# Patient Record
Sex: Female | Born: 1960
Health system: Southern US, Community
[De-identification: ages and names within clinical notes are randomized; demographics above are authoritative.]

## PROBLEM LIST (undated history)

## (undated) DIAGNOSIS — I10 Essential (primary) hypertension: Secondary | ICD-10-CM

## (undated) DIAGNOSIS — B1920 Unspecified viral hepatitis C without hepatic coma: Secondary | ICD-10-CM

---

## 2003-11-25 ENCOUNTER — Observation Stay (HOSPITAL_COMMUNITY): Admission: AD | Admit: 2003-11-25 | Discharge: 2003-11-26 | Payer: Self-pay | Admitting: Family Medicine

## 2006-08-18 ENCOUNTER — Emergency Department (HOSPITAL_COMMUNITY): Admission: EM | Admit: 2006-08-18 | Discharge: 2006-08-18 | Payer: Self-pay | Admitting: Emergency Medicine

## 2007-10-10 ENCOUNTER — Ambulatory Visit: Payer: Self-pay | Admitting: *Deleted

## 2007-12-15 ENCOUNTER — Ambulatory Visit: Payer: Self-pay | Admitting: Family Medicine

## 2008-02-08 ENCOUNTER — Ambulatory Visit: Payer: Self-pay | Admitting: Internal Medicine

## 2008-02-14 ENCOUNTER — Encounter (INDEPENDENT_AMBULATORY_CARE_PROVIDER_SITE_OTHER): Payer: Self-pay | Admitting: Family Medicine

## 2008-02-14 ENCOUNTER — Ambulatory Visit: Payer: Self-pay | Admitting: Internal Medicine

## 2008-02-14 LAB — CONVERTED CEMR LAB
ALT: 22 U/L
AST: 22 U/L
Albumin: 4.4 g/dL
Alkaline Phosphatase: 45 U/L
BUN: 13 mg/dL
Basophils Absolute: 0 K/uL
Basophils Relative: 0 %
CO2: 23 meq/L
Calcium: 9.2 mg/dL
Chloride: 105 meq/L
Cholesterol: 198 mg/dL
Creatinine, Ser: 0.72 mg/dL
Eosinophils Absolute: 0 K/uL
Eosinophils Relative: 1 %
Glucose, Bld: 134 mg/dL — ABNORMAL HIGH
HCT: 39 %
HDL: 62 mg/dL
Hemoglobin: 12.4 g/dL
LDL Cholesterol: 119 mg/dL — ABNORMAL HIGH
Lymphocytes Relative: 47 % — ABNORMAL HIGH
Lymphs Abs: 3.4 K/uL
MCHC: 31.8 g/dL
MCV: 90.9 fL
Monocytes Absolute: 0.6 K/uL
Monocytes Relative: 9 %
Neutro Abs: 3.2 K/uL
Neutrophils Relative %: 44 %
Platelets: 187 K/uL
Potassium: 4.2 meq/L
RBC: 4.29 M/uL
RDW: 14.8 %
Sodium: 139 meq/L
TSH: 2.299 u[IU]/mL
Total Bilirubin: 0.5 mg/dL
Total CHOL/HDL Ratio: 3.2
Total Protein: 6.6 g/dL
Triglycerides: 84 mg/dL
VLDL: 17 mg/dL
Vit D, 1,25-Dihydroxy: 10 — ABNORMAL LOW
WBC: 7.4 10*3/microliter

## 2008-04-19 ENCOUNTER — Ambulatory Visit: Payer: Self-pay | Admitting: Internal Medicine

## 2008-05-03 ENCOUNTER — Ambulatory Visit: Payer: Self-pay | Admitting: Family Medicine

## 2008-10-04 ENCOUNTER — Ambulatory Visit: Payer: Self-pay | Admitting: Family Medicine

## 2008-10-04 LAB — CONVERTED CEMR LAB
Cholesterol: 185 mg/dL (ref 0–200)
Microalb, Ur: 1.85 mg/dL (ref 0.00–1.89)
VLDL: 20 mg/dL (ref 0–40)
Vit D, 25-Hydroxy: 66 ng/mL (ref 30–89)

## 2009-02-28 ENCOUNTER — Ambulatory Visit: Payer: Self-pay | Admitting: Family Medicine

## 2009-07-10 ENCOUNTER — Ambulatory Visit: Payer: Self-pay | Admitting: Family Medicine

## 2009-07-10 LAB — CONVERTED CEMR LAB
AST: 31 units/L (ref 0–37)
Alkaline Phosphatase: 58 units/L (ref 39–117)
CO2: 26 meq/L (ref 19–32)
Chloride: 103 meq/L (ref 96–112)
Creatinine, Ser: 0.85 mg/dL (ref 0.40–1.20)
HCV Quantitative: 60500000 intl units/mL — ABNORMAL HIGH (ref <43)
Potassium: 4.3 meq/L (ref 3.5–5.3)
Sodium: 140 meq/L (ref 135–145)

## 2009-09-06 ENCOUNTER — Ambulatory Visit: Payer: Self-pay | Admitting: Family Medicine

## 2009-11-28 ENCOUNTER — Emergency Department (HOSPITAL_COMMUNITY): Admission: EM | Admit: 2009-11-28 | Discharge: 2009-11-28 | Payer: Self-pay | Admitting: Family Medicine

## 2010-03-23 ENCOUNTER — Encounter: Payer: Self-pay | Admitting: Pulmonary Disease

## 2010-03-23 ENCOUNTER — Encounter: Payer: Self-pay | Admitting: Family Medicine

## 2010-04-17 ENCOUNTER — Ambulatory Visit: Payer: Self-pay | Admitting: Gastroenterology

## 2010-04-17 DIAGNOSIS — B182 Chronic viral hepatitis C: Secondary | ICD-10-CM

## 2010-05-06 ENCOUNTER — Other Ambulatory Visit: Payer: Self-pay | Admitting: Gastroenterology

## 2010-05-06 DIAGNOSIS — B1920 Unspecified viral hepatitis C without hepatic coma: Secondary | ICD-10-CM

## 2010-05-14 ENCOUNTER — Ambulatory Visit (HOSPITAL_COMMUNITY): Payer: Self-pay

## 2010-05-14 ENCOUNTER — Other Ambulatory Visit: Payer: Self-pay | Admitting: Diagnostic Radiology

## 2010-05-14 ENCOUNTER — Ambulatory Visit (HOSPITAL_COMMUNITY)
Admission: RE | Admit: 2010-05-14 | Discharge: 2010-05-14 | Disposition: A | Discharge status: Home / Self Care | Payer: Self-pay | Source: Ambulatory Visit | Attending: Gastroenterology | Admitting: Gastroenterology

## 2010-05-14 ENCOUNTER — Other Ambulatory Visit: Payer: Self-pay

## 2010-05-14 DIAGNOSIS — L408 Other psoriasis: Secondary | ICD-10-CM | POA: Insufficient documentation

## 2010-05-14 DIAGNOSIS — Z01812 Encounter for preprocedural laboratory examination: Secondary | ICD-10-CM | POA: Insufficient documentation

## 2010-05-14 DIAGNOSIS — B1920 Unspecified viral hepatitis C without hepatic coma: Secondary | ICD-10-CM

## 2010-05-14 DIAGNOSIS — I10 Essential (primary) hypertension: Secondary | ICD-10-CM | POA: Insufficient documentation

## 2010-05-14 DIAGNOSIS — B182 Chronic viral hepatitis C: Secondary | ICD-10-CM | POA: Insufficient documentation

## 2010-05-14 DIAGNOSIS — E119 Type 2 diabetes mellitus without complications: Secondary | ICD-10-CM | POA: Insufficient documentation

## 2010-05-14 LAB — PROTIME-INR: Prothrombin Time: 12.5 seconds (ref 11.6–15.2)

## 2010-05-14 LAB — CBC
MCH: 28.7 pg (ref 26.0–34.0)
Platelets: 146 10*3/uL — ABNORMAL LOW (ref 150–400)
RBC: 4.57 MIL/uL (ref 3.87–5.11)
WBC: 6.8 10*3/uL (ref 4.0–10.5)

## 2010-05-14 LAB — APTT: aPTT: 36 seconds (ref 24–37)

## 2010-05-15 LAB — GLUCOSE, CAPILLARY: Glucose-Capillary: 119 mg/dL — ABNORMAL HIGH (ref 70–99)

## 2010-07-18 NOTE — H&P (Signed)
NAME:  [name], MARTELL NO.:  [id number]   MEDICAL RECORD NO.:  [id number]          PATIENT TYPE:  AMB   LOCATION:  SDC                           FACILITY:  [facility]   PHYSICIAN:  Naima A. Dillard, M.D. DATE OF BIRTH:  [birth year]   DATE OF ADMISSION:  01/21/2004  DATE OF DISCHARGE:                                HISTORY & PHYSICAL   CHIEF COMPLAINT:  Right Bartholin's abscess.   HISTORY OF PRESENT ILLNESS:  The patient is a 50 year old African American  female, gravida 5, para 2-0-3-2 who presented in the emergency room back in  September 2005 with a right Bartholin's abscess.  The patient states that  she does not recall ever having a Bartholin's cyst or abscess in the past.  It was drained, and a catheter was placed and the abscess was successfully  drained and treated.  She also was treated with antibiotics at the time.  The patient has asked for marsipulization of the cyst with a biopsy.  She  says she has never had a Bartholin's cyst before.   PAST MEDICAL HISTORY:  Significant for hypertension.   PAST SURGICAL HISTORY:  Significant for elective abortion x3 without any  complication.  Tubes tied.  She also had her wisdom teeth removed.   PAST GYNECOLOGIC HISTORY:  Significant for menarche at age 54, lasting for 3-  4 days.  She does have a history of Trichomonas and ovarian cysts.  The  patient states that she never had an abnormal Pap smear.   ALLERGIES:  The patient has no known drug allergies.   MEDICATIONS:  She is currently on no medications.   SOCIAL HISTORY:  The patient denies any alcohol use.  She does occasionally  use cocaine which she is seeking help to rid herself of the habit.  She  denies any tobacco use.   FAMILY HISTORY:  Significant for high blood pressure, diabetes, sickle cell  disease and heart disease.   REVIEW OF SYSTEMS:  Genitourinary as noted above.  Cardiovascular is  significant for hypertension.  GI is unremarkable.   Psychosocial is  significant for cocaine abuse.  Musculoskeletal is unremarkable.   PHYSICAL EXAMINATION:  VITAL SIGNS:  The patient's blood pressure is 120/90.  HEENT:  Hearing is normal.  Throat is clear.  NECK:  Thyroid is not enlarged.  HEART:  Regular rate and rhythm.  LUNGS:  Clear to auscultation bilaterally.  BACK:  No CVA tenderness bilaterally.  ABDOMEN:  Nontender without any masses, organomegaly.  EXTREMITIES:  No cyanosis, clubbing or edema.  NEUROLOGICAL: Exam is within normal limits.  PELVIC:  The patient had a right Bartholin abscess that was drained with the  Wurd catheter.  There was no more purulent material exiting from the  abscess.  The area was clearing and healing in the normal fashion.  The  vagina was normal.  The cervix was normal.  It was nontender without any  lesions.  Uterus was normal shape, size and consistency and nontender.  Adnexae has no masses and is nontender.   ASSESSMENT:  Bartholin's abscess.  PLAN:  The patient is changed to Keflex.  We will plan to do  marsupialization and biopsy of the cyst.  The patient is supposed to  schedule an annual exam and has not yet showed up for her Pap smear.  We  will plan to do so.  The patient understands the risks of the procedure,  bleeding, infection, damage to internal organs such as vagina and vulva.  The patient consents to the procedure.     Naim   NAD/MEDQ  D:  01/21/2004  T:  01/21/2004  Job:  [id number]

## 2010-09-02 ENCOUNTER — Emergency Department (HOSPITAL_COMMUNITY)
Admission: EM | Admit: 2010-09-02 | Discharge: 2010-09-02 | Disposition: A | Discharge status: Home / Self Care | Payer: Self-pay | Attending: Emergency Medicine | Admitting: Emergency Medicine

## 2010-09-02 ENCOUNTER — Inpatient Hospital Stay (INDEPENDENT_AMBULATORY_CARE_PROVIDER_SITE_OTHER)
Admission: RE | Admit: 2010-09-02 | Discharge: 2010-09-02 | Disposition: A | Discharge status: Home / Self Care | Payer: Self-pay | Source: Ambulatory Visit | Attending: Family Medicine | Admitting: Family Medicine

## 2010-09-02 DIAGNOSIS — R5381 Other malaise: Secondary | ICD-10-CM | POA: Insufficient documentation

## 2010-09-02 DIAGNOSIS — I10 Essential (primary) hypertension: Secondary | ICD-10-CM | POA: Insufficient documentation

## 2010-09-02 DIAGNOSIS — E119 Type 2 diabetes mellitus without complications: Secondary | ICD-10-CM | POA: Insufficient documentation

## 2010-09-02 DIAGNOSIS — R7989 Other specified abnormal findings of blood chemistry: Secondary | ICD-10-CM

## 2010-09-02 LAB — POCT URINALYSIS DIP (DEVICE)
Ketones, ur: 15 mg/dL — AB
Protein, ur: NEGATIVE mg/dL
Specific Gravity, Urine: <=1.005 (ref 1.005–1.030)

## 2010-09-02 LAB — CBC
HCT: 40 % (ref 36.0–46.0)
Hemoglobin: 14.4 g/dL (ref 12.0–15.0)
MCV: 83.7 fL (ref 78.0–100.0)
RBC: 4.78 MIL/uL (ref 3.87–5.11)
WBC: 12 10*3/uL — ABNORMAL HIGH (ref 4.0–10.5)

## 2010-09-02 LAB — URINALYSIS, ROUTINE W REFLEX MICROSCOPIC
Glucose, UA: >1000 mg/dL — AB
Hgb urine dipstick: NEGATIVE
Leukocytes, UA: NEGATIVE
Specific Gravity, Urine: 1.039 — ABNORMAL HIGH (ref 1.005–1.030)
Urobilinogen, UA: 0.2 mg/dL (ref 0.0–1.0)

## 2010-09-02 LAB — URINE MICROSCOPIC-ADD ON

## 2010-09-02 LAB — DIFFERENTIAL
Eosinophils Relative: 0 % (ref 0–5)
Lymphocytes Relative: 28 % (ref 12–46)
Lymphs Abs: 3.4 10*3/uL (ref 0.7–4.0)
Neutro Abs: 7.7 10*3/uL (ref 1.7–7.7)
Neutrophils Relative %: 64 % (ref 43–77)

## 2010-09-02 LAB — POCT I-STAT, CHEM 8
BUN: 14 mg/dL (ref 6–23)
Chloride: 90 mEq/L — ABNORMAL LOW (ref 96–112)
Creatinine, Ser: 0.9 mg/dL (ref 0.50–1.10)
Sodium: 129 mEq/L — ABNORMAL LOW (ref 135–145)
TCO2: 25 mmol/L (ref 0–100)

## 2010-09-02 LAB — GLUCOSE, CAPILLARY: Glucose-Capillary: >600 mg/dL (ref 70–99)

## 2010-09-02 LAB — BASIC METABOLIC PANEL
BUN: 14 mg/dL (ref 6–23)
CO2: 24 mEq/L (ref 19–32)
Chloride: 88 mEq/L — ABNORMAL LOW (ref 96–112)
Creatinine, Ser: 0.9 mg/dL (ref 0.50–1.10)
Glucose, Bld: 620 mg/dL (ref 70–99)
Potassium: 4.3 mEq/L (ref 3.5–5.1)

## 2010-09-04 LAB — URINE CULTURE: Culture  Setup Time: 201207040216

## 2010-10-03 ENCOUNTER — Telehealth: Payer: Self-pay | Admitting: Gastroenterology

## 2010-10-03 NOTE — Telephone Encounter (Signed)
Questions for the application for free medication answered.  [facility] will review the application and fax a reply to [facility].

## 2010-10-09 ENCOUNTER — Other Ambulatory Visit: Payer: Self-pay | Admitting: Gastroenterology

## 2010-10-09 ENCOUNTER — Telehealth: Payer: Self-pay | Admitting: Gastroenterology

## 2010-10-09 DIAGNOSIS — B182 Chronic viral hepatitis C: Secondary | ICD-10-CM

## 2010-10-09 NOTE — Telephone Encounter (Signed)
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 She is requesting her appointment be any time after 2pm due to her work schedule if possible. These are the following meds that she has:  Pegasys  Copegus  Incivek/Telaprevir   [facility]  [address]  [place], [place] [address]  [phone]  This electronic message may contain information that is confidential and/or legally privileged. It is intended only for the use of the designated individual(s) and entity named aboved. If you are not the an intented recipient of this message, please notify the sender immediately and delete the material from any computer. Do not deliver, distribute or copy this message, and do not disclose its contents.

## 2010-10-09 NOTE — Progress Notes (Signed)
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 She is requesting her appointment be any time after 2pm due to her work schedule if possible. These are the following meds that she has:  Pegasys Copegus Incivek/Telatrevir  [facility]  [address]  [place], [place] [address] [phone] This electronic message may contain information that is confidential and/or legally privileged. It is intended only for the use of the designated individual(s) and entity named aboved. If you are not the an intented recipient of this message, please notify the sender immediately and delete the material from any computer. Do not deliver, distribute or copy this message, and do not disclose its contents.

## 2010-10-23 ENCOUNTER — Other Ambulatory Visit: Payer: Self-pay | Admitting: Gastroenterology

## 2010-10-23 ENCOUNTER — Ambulatory Visit (INDEPENDENT_AMBULATORY_CARE_PROVIDER_SITE_OTHER): Payer: Self-pay | Admitting: Gastroenterology

## 2010-10-23 DIAGNOSIS — B182 Chronic viral hepatitis C: Secondary | ICD-10-CM

## 2010-10-27 LAB — HEPATITIS C RNA QUANTITATIVE
HCV Quantitative Log: 7.34 {Log} — ABNORMAL HIGH (ref <1.63)
HCV Quantitative: 21700000 [IU]/mL — ABNORMAL HIGH (ref <43)

## 2010-10-30 ENCOUNTER — Ambulatory Visit (INDEPENDENT_AMBULATORY_CARE_PROVIDER_SITE_OTHER): Payer: Self-pay | Admitting: Gastroenterology

## 2010-10-30 VITALS — BP 115/80 | HR 90 | Temp 96.7°F | Ht 62.0 in | Wt 109.0 lb

## 2010-10-30 DIAGNOSIS — B182 Chronic viral hepatitis C: Secondary | ICD-10-CM

## 2010-11-06 ENCOUNTER — Ambulatory Visit (INDEPENDENT_AMBULATORY_CARE_PROVIDER_SITE_OTHER): Payer: Self-pay | Admitting: Gastroenterology

## 2010-11-06 DIAGNOSIS — B182 Chronic viral hepatitis C: Secondary | ICD-10-CM

## 2010-11-06 LAB — CBC WITH DIFFERENTIAL/PLATELET
Basophils Absolute: 0 10*3/uL (ref 0.0–0.1)
Basophils Relative: 0 % (ref 0–1)
Eosinophils Relative: 1 % (ref 0–5)
Hemoglobin: 11.8 g/dL — ABNORMAL LOW (ref 12.0–15.0)
MCHC: 32.7 g/dL (ref 30.0–36.0)
Monocytes Absolute: 0.6 10*3/uL (ref 0.1–1.0)
Neutro Abs: 2.8 10*3/uL (ref 1.7–7.7)
RBC: 4.06 MIL/uL (ref 3.87–5.11)
RDW: 14.6 % (ref 11.5–15.5)
WBC: 6.2 10*3/uL (ref 4.0–10.5)

## 2010-11-06 NOTE — Progress Notes (Signed)
  NAME:  [name], [name]  MR#:  [id number]      DATE:  10/23/2010  DOB:  06-Aug-1960    cc:     Referring physician:   Dow Adolph, MD   [facility]  [address] [place],   [place]   [address] Fax:  [phone]   REASON FOR VISIT:  Follow up genotype 1B HCV for initiation of therapy.    History of presenting illness:  The patient returns today unaccompanied. Since last being seen, there are no symptoms referable to her history of hepatitis C, nor are there symptoms to suggest cryoglobulin mediated or decompensated liver  disease.  It will be recalled that she is genotype 1B, IL28-B CT, liver biopsy showing grade 2 stage 1 disease on 05/14/2010.   After discussion, the patient wants to pursue treatment, and I will start her on pegylated interferon, ribavirin, and telaprevir. She comes in today for teaching.    Past medical history:  In the interval, she has become an insulin-requiring diabetic.   CURRENT MEDICATIONS:  1. Novolin-N on a sliding scale a.c. 2. Januvia 100 mg p.o. daily Metformin 5 mg p.o. b.i.d. 3. Micardis 40 mg p.o. daily.   Allergies:  Denies.   Smoking:  Never.    Alcohol:  Denies interval consumption.   REVIEW OF SYSTEMS:  All 10 systems reviewed today with the patient, and they are negative other than that which is mentioned above. Her CES-D was 10.   PHYSICAL EXAMINATION:  Constitutional:  Well appearing without significant peripheral wasting. Vital signs: Height 62 inches, weight 116, pounds blood pressure 163/101, pulse of 98, temperature 96.6 Fahrenheit.   ASSESSMENT:  The patient is a 50 year old woman with a history of genotype 1B hepatitis C, IL28-B CT, liver biopsy of 05/14/2010 showing grade 2  stage 1 disease. She is going to commence therapy on 10/24/2010 with PEG interferon, ribavirin, and telaprevir.   DISCUSSION:  Today, I discussed with the patient methods by which she should administer her pegylated  interferon. I demonstrated to her with her medications how to do the injections and how to titrate the dose based  on my instructions. We also discussed dosing of the telaprevir. I have given the patient an instruction manual from [facility] and reviewed the  importance of timing of her dosing and compliance and the side effects. We also discussed dosing of the ribavirin.   plan:  1. Will check a baseline HCV RNA down pretreatment. 2. She is to start tomorrow on 10/24/2010 as her start date. 3. She is to return in 2 weeks' time for her first CBC. 4. She needs hepatitis A and B vaccinations through [facility].            Brooke Dare, MD   [phone]  D:  Thu Aug 23 21:27:34 2012 ; T:  Sat Aug 25 14:05:58 2012  Job #:  [id number]

## 2010-11-13 ENCOUNTER — Other Ambulatory Visit: Payer: Self-pay | Admitting: Gastroenterology

## 2010-11-13 LAB — CBC WITH DIFFERENTIAL/PLATELET
Basophils Absolute: 0 K/uL (ref 0.0–0.1)
Basophils Relative: 0 % (ref 0–1)
Eosinophils Absolute: 0 K/uL (ref 0.0–0.7)
Eosinophils Relative: 0 % (ref 0–5)
HCT: 35.7 % — ABNORMAL LOW (ref 36.0–46.0)
Hemoglobin: 11.2 g/dL — ABNORMAL LOW (ref 12.0–15.0)
Lymphocytes Relative: 47 % — ABNORMAL HIGH (ref 12–46)
Lymphs Abs: 2.5 K/uL (ref 0.7–4.0)
MCH: 28.6 pg (ref 26.0–34.0)
MCHC: 31.4 g/dL (ref 30.0–36.0)
MCV: 91.3 fL (ref 78.0–100.0)
Monocytes Absolute: 0.5 K/uL (ref 0.1–1.0)
Monocytes Relative: 9 % (ref 3–12)
Neutro Abs: 2.3 K/uL (ref 1.7–7.7)
Neutrophils Relative %: 44 % (ref 43–77)
Platelets: 105 K/uL — ABNORMAL LOW (ref 150–400)
RBC: 3.91 MIL/uL (ref 3.87–5.11)
RDW: 14.5 % (ref 11.5–15.5)
WBC: 5.3 K/uL (ref 4.0–10.5)

## 2010-11-13 NOTE — Progress Notes (Signed)
NAME:  [name], [name]  MR#:  [id number]      DATE:  10/30/2010  DOB:  [birth year]    cc: Referring Physician:  Dow Adolph, MD, [facility] [address], [address] [place], [place] [address], Fax [phone]    REASON FOR VISIT:  Nausea on week 1 of genotype 1B HCV therapy.    History:  The patient returns today as a walk-in unaccompanied. I saw her last week on 10/23/2010, for teaching. She was to commence her PEG interferon injection the next day on 10/24/2010, as well as her  ribavirin and telaprevir oral therapy. Today, she reports that because her grandchildren were coming over to visit her on the weekend, she decided to delay her PEG interferon injection until Monday, but then  never took that at all the last week. However, she started the telaprevir and ribavirin on 10/24/2010, and continued it until this morning. She has experienced some nausea, which worsened today leading  her to come in without an appointment to discuss this. Because of the nausea, she did not take any of her medications this morning such that she has missed 1 dose of telaprevir this morning, in addition to  missing her dose of Pegylated interferon last week. Aside from the nausea, there are no complaints at this time.   Past MEDICAL HISTORY:  Type 2 diabetes.   CURRENT MEDICATIONS:  Micardis 40 mg p.o. daily, metformin 5 mg p.o. daily, Actos 15 mg p.o. b.i.d., Pegasys 180 mcg subcu weekly, ribavirin 600 mg p.o. a.m., 400 mg p.o. p.m. and telaprevir 750 mg p.o. q. 8 hours.    Allergies:  Denies.    Habits:  Smoking, never. Alcohol denies interval consumption.   REVIEW OF SYSTEMS:  All 10 systems reviewed today with the patient and they are negative other which is mentioned above. She did not complete the CES-D today.   PHYSICAL EXAMINATION:   Constitutional:  No apparent distress. She is very thin but no significant peripheral wasting. Vital signs: Height 62 inches, weight  109 pounds, down from 116  previously, blood pressure 115/80, pulse 90, temperature 96.7 Fahrenheit.   ASSESSMENT:  The patient is a 50 year old woman with a history of genotype 1b hepatitis C, IL 28 B CT, liver biopsy on 05/14/2010, showing grade 2, stage I disease. She was supposed to commence all 3 drugs on  10/24/2010, but only commenced the telaprevir and ribavirin on that day and has missed the entire week of pegylated interferon. In addition, she has missed her dose of telaprevir this morning. This all  increases the risk of a not only nonresponse to treatment but resistance to the protease inhibitor class of antivirals. If she misses another dose of her medication, I will need to discontinue her  treatment altogether.  Today, I explained to her that we can use Phenergan to treat her nausea such that she remain on her oral medications. I admonished her for not taking the pegylated interferon. I have explained to her that  she cannot pick and choose the medication that she can take on this regimen, because the risk of toxicities, the resistance, and nonresponse to treatment. I have therefore, I have given her a  prescription for Phenergan instructing her how to use this. In addition I instructed her that she is to take her telaprevir dosing on  schedule today. I have also told her that she needs to take the Pegasys tomorrow, which will be her second of treatment.   plan:  1. Phenergan 25 mg p.o. t.i.d., 80 tablets with 1 refill given. 2. She is to take her telaprevir and ribavirin today as scheduled. 3. Her first injection of Pegasys will be at week 2, tomorrow on 10/31/2010. 4. She is to return for her week 2 visit, next week on 11/06/2010. 5. She needs hepatitis A and B vaccination through [facility].            Brooke Dare, MD   [phone]  D:  Thu Aug 30 18:37:53 2012 ; T:  Thu Aug 30 21:39:05 2012  Job #:  [id number]

## 2010-11-20 ENCOUNTER — Ambulatory Visit (INDEPENDENT_AMBULATORY_CARE_PROVIDER_SITE_OTHER): Payer: Self-pay | Admitting: Gastroenterology

## 2010-11-20 DIAGNOSIS — B182 Chronic viral hepatitis C: Secondary | ICD-10-CM

## 2010-11-20 NOTE — Progress Notes (Signed)
   NAME:  [name], [name]  MR#:  [id number]      DATE:  11/06/2010  DOB:  1May 07, 1962    cc:     Referring physician:  Dow Adolph, MD [facility] [address]  [place], [place]  [address] Fax:  [phone]   REASON FOR VISIT:  Follow up of genotype 1b HCV, week 2 of therapy.    HISTORY:  The patient returns today unaccompanied. It will be recalled that she presented to the clinic on 10/30/2010, having started her ribavirin and telaprevir without starting her pegylated interferon on  10/27/2010. She had delayed the start of the pegylated interferon for fear of side effects and did not start it before she presented with several complaints. At the time I reassured her that the side effects  were consistent with her treatment but admonished her for not starting treatment with all 3 drugs together as I instructed her to do at the teaching session.  Today she reports that,  in addition to starting the ribavirin and telaprevir on 10/27/2010, she started the Pegasys on 10/31/2010. At this time she does not have any complaints referable to her treatment.  She notes that she has gained 4 pounds since her last clinic appointment. She has not needed any Phenergan for the complaints of nausea that she had when I saw her last time.   PAST MEDICAL HISTORY:  Significant for type 2 diabetes. She reports her blood sugar this morning was 114.    CURRENT MEDICATIONS:  1. Telaprevir 750 mg p.o. q.8 hours. 2. Ribavirin 600 mg mg p.o. q.a.m., 400 mg p.o. q. p.m.  3. Pegasys 180 mcg subcu weekly.  4. Novolin N on sliding scale a.c.  5. Metformin 500 mg p.o. b.i.d. 6. Micardis 40 mg p.o. daily.  7. Actos 15 mg p.o. daily.  Allergies:  Denies.   habits:   Smoking:  Never.    Alcohol:  Denies interval consumption.   REVIEW OF SYSTEMS:  All 10 systems reviewed today with the patient, and they are negative other than that which was mentioned above. Her CES-D was 21.    PHYSICAL  EXAMINATION:   Constitutional:  Well appearing.   Vital signs:  Height 62 inches, weight 113 pounds, blood pressure 129/88, pulse of 97, temperature 97.6 degrees Fahrenheit.    ASSESSMENT:  The patient is a 50 year old woman with genotype 1b hepatitis C virus, IL28B CT, with liver biopsy on 05/14/2010 showing grade 2, stage I disease. For the purposes of calculation, she commenced therapy on  10/27/2010, though her first dose of Pegasys was 4 days late.  She is now approximately week 2 of treatment. She is now tolerating therapy much better with education.  In my discussion today with the patient we discussed the course of therapy to date. We discussed side effect management.    Baseline viral load 21,700,000 international units per mL on 10/23/2010.   plan:  1. CBC today. 2. She was given written standing orders for weekly CBC to commence next week.  3. She needs hepatitis A and B vaccinations through [facility]. 4. She is to return in 2 weeks' time for week 4 visit with a CBC and viral load at that time.            Brooke Dare, MD   [phone]  D:  Thu Sep 06 21:10:51 2012 ; T:  Sat Sep 08 17:17:07 2012  Job #:  [id number]

## 2010-11-21 LAB — HEPATIC FUNCTION PANEL
ALT: 13 U/L (ref 0–35)
Albumin: 4.5 g/dL (ref 3.5–5.2)
Total Protein: 7 g/dL (ref 6.0–8.3)

## 2010-11-21 LAB — TSH: TSH: 1.386 u[IU]/mL (ref 0.350–4.500)

## 2010-11-27 NOTE — Progress Notes (Signed)
   NAME:  [name], [name]  MR#:  [id number]      DATE:  11/20/2010  DOB:  1April 24, 1962    cc: Referring Physician:  Dow Adolph, MD, [address] [place], [place] [address], Fax [phone]    REASON FOR VISIT:  Follow up of genotype 1b HCV week 4 of treatment.   History:  The patient returns today unaccompanied. She is due to have her fourth injection of Pegasys tomorrow. She says she is compliant with all medications. She has no complaints currently. Within the last week or  so, she was nauseated at work and her employer wanted to send her home, but she insisted on staying. She brought in an FMLA form that she thought would need to be completed if she was to take time off  work, but then realizing I would have to state her diagnosis, she did not want the form completed.   PAST MEDICAL HISTORY:  Significant for type 2 diabetes.   CURRENT MEDICATIONS:  Telaprevir 750 mg p.o. q. 8 hours, ribavirin 600 mg p.o. q.a.m., 400 mg p.o. q. p.m., Pegasys 180 mcg subcu weekly, Novolin sliding scale  a.c., metformin 500 mg p.o. b.i.d., Micardis 40 mg p.o. daily, Actos 15 mg p.o. daily.   ALLERGIES:  Denies.   HABITS:  Smoking, never. Alcohol, denies interval consumption.   REVIEW OF SYSTEMS:  All 10 systems reviewed today with the patient and they are negative other than which was mentioned above. Her CES-D was not completed.   PHYSICAL EXAMINATION:   Constitutional:  Well appearing. Vital signs: Height 62 inches, weight 113 pounds unchanged from previously, blood pressure 123/87, pulse of 89, temperature 97.4 Fahrenheit.   laboratories:  On 11/06/2010; her hemoglobin was 11.8 from her pretreatment of 14.4, white count 6.2, ANC 42.8, platelet count 150.   ASSESSMENT:  The patient is a 50 year old woman with history of genotype 1b hepatitis C, IL 28 B CT, a liver biopsy on 05/14/2010, showing grade 2 stage I disease. For the purpose of calculation, she commenced  therapy  on 10/27/2010, her fourth dose of Pegasys was 4 days late. She is now at approximately week 4 of treatment. She is tolerating therapy well.  In my discussion today, we discussed the importance of compliance with treatment, which she states she understands. We also discussed the side effects management.   Plan:  1. CBC today. 2. Week 4 HCV RNA today. 3. Liver enzymes today. 4. She will need hepatitis A and B vaccination through [facility]. 5. To return in 2 week's time.            Brooke Dare, MD   ADDEDNUM  CBC and Week 4 viral load was not done as ordered.  I will ask that she be called to get a viral load the next week.  [phone] .[id number]  D:  Thu Sep 20 19:25:42 2012 ; T:  Thu Sep 20 19:59:16 2012  Job #:  [id number]

## 2010-11-28 ENCOUNTER — Other Ambulatory Visit: Payer: Self-pay | Admitting: Gastroenterology

## 2010-11-28 LAB — CBC WITH DIFFERENTIAL/PLATELET
Basophils Relative: 0 % (ref 0–1)
Hemoglobin: 9.5 g/dL — ABNORMAL LOW (ref 12.0–15.0)
MCHC: 31.5 g/dL (ref 30.0–36.0)
Monocytes Relative: 13 % — ABNORMAL HIGH (ref 3–12)
Neutro Abs: 1.7 10*3/uL (ref 1.7–7.7)
Neutrophils Relative %: 41 % — ABNORMAL LOW (ref 43–77)
Platelets: 140 10*3/uL — ABNORMAL LOW (ref 150–400)
RBC: 3.31 MIL/uL — ABNORMAL LOW (ref 3.87–5.11)

## 2010-12-02 LAB — HCV RNA QUANT RFLX ULTRA OR GENOTYP
HCV Quantitative Log: <1.63 {Log} (ref <1.63)
HCV Quantitative: <43 IU/mL (ref <43)

## 2010-12-04 ENCOUNTER — Ambulatory Visit (INDEPENDENT_AMBULATORY_CARE_PROVIDER_SITE_OTHER): Payer: Self-pay | Admitting: Gastroenterology

## 2010-12-04 ENCOUNTER — Other Ambulatory Visit: Payer: Self-pay | Admitting: Gastroenterology

## 2010-12-04 DIAGNOSIS — B182 Chronic viral hepatitis C: Secondary | ICD-10-CM

## 2010-12-04 LAB — CBC WITH DIFFERENTIAL/PLATELET
Basophils Absolute: 0 10*3/uL (ref 0.0–0.1)
Basophils Relative: 0 % (ref 0–1)
Hemoglobin: 9.1 g/dL — ABNORMAL LOW (ref 12.0–15.0)
MCHC: 31.2 g/dL (ref 30.0–36.0)
Monocytes Relative: 12 % (ref 3–12)
Neutro Abs: 2.5 10*3/uL (ref 1.7–7.7)
Neutrophils Relative %: 58 % (ref 43–77)
Platelets: 129 10*3/uL — ABNORMAL LOW (ref 150–400)
RDW: 14.3 % (ref 11.5–15.5)

## 2010-12-11 ENCOUNTER — Other Ambulatory Visit: Payer: Self-pay | Admitting: Gastroenterology

## 2010-12-11 NOTE — Progress Notes (Signed)
NAME:  [name], [name]  MR#:  [id number]      DATE:  12/04/2010  DOB:  [birth year]    cc: Referring Physician:  Dow Adolph, MD, [facility] [address], [address], [place], [place] [address], Fax [phone]    REASON FOR VISIT:  Followup genotype 1b HCV, week 6 of treatment.   History:  The patient returns today unaccompanied. She is due to have her sixth injection of Pegasys tomorrow. She states she is compliant with medications, however the patient complains of significant nausea. She  is concerned that she may have vomited doses of telaprevir perhaps once a week in the morning when she takes her morning medications. She also complains of significant fatigue, dizziness, and lightheadedness.  Within the last few days the patient reported reaching into a pocket and sticking her right D3 digits with a pin. It subsequently became  infected and currently looks like she has a small abscess in the tip of her finger. It will be recalled that she is a diabetic.   PAST MEDICAL HISTORY:  Significant for type 2 diabetes. She reports blood sugars are as low as 114 first thing in the morning, fasting.   CURRENT MEDICATIONS:  Novolin 20 units subcu daily, telaprevir 750 mg p.o. q. 8 hours, ribavirin 600 mg p.o. q. a.m., 400 mg p.o. q. p.m., Pegasys 180 mcg  subcu weekly, metformin 500 mg p.o. b.i.d., Januvia dose unknown daily.   Allergies:  Denies.   HABITS:  Smoking, never. Alcohol, denies interval consumption.   REVIEW OF SYSTEMS:  All 10 systems reviewed today with the patient and they are negative other than which is mentioned above. She did not complete the CES-D work sheet.   PHYSICAL EXAMINATION:   Constitutional:  Well appearing but thin. Vital signs: Height 62 inches, weight 112 pounds down a pound from previously, blood pressure 138/86, pulse of 102, temperature 97.6 Fahrenheit.   The tip of the right D3 finger had a small, approximately 7 mm abscess. I swabbed it with alcohol  and with a 25-gauge needle opened it and drained it fully.   Laboratories:  Lab work from 11/20/2010; ALT was 13. TSH was 1.386.   Her lab work on 11/27/2010 were not done as ordered, and therefore she was brought back on 11/28/2010 for the following labs. Her CBC showed  a white count of 4, hemoglobin was 9.5, MCV 91.2, platelet count 140,000. Her ANC was 1.7. HCV RNA was detectable but not quantifiable.   ASSESSMENT:  The patient is a 50 year old woman with a history of genotype 1ab hepatitis C, IL 28 b CT, liver biopsy 05/14/2010, showing grade 2 stage I disease. For the purpose of calculation, she commenced therapy  on 10/27/2010. It will be recalled that her fourth dose of Pegasys was 4 days late. She is now approximately week 6 of therapy. I am concerned about the nausea and vomiting that she is having. In  addition, she has developed an infected tip of the right third digit, which I have now I and D.  In my discussion today with the patient, I instructed she to soak the finger in peroxide twice daily but not apply any alcohol. We also discussed the importance of starting on antibiotics for this. We then  discussed the importance of complying with all of her medications for hepatitis C therapy. To that end, we discussed using antinausea medications.   PLAN:  1. For the anemia, I have instructed her to reduce the ribavirin  to 400 mg p.o. q. a.m. and 200 mg p.o. p.m. 2. Keflex 500 mg p.o. q.i.d., 10 days supply for the finger. 3. Soak the finger in peroxide twice daily. 4. She is to continue with weekly CBCs. 5. For nausea, I have given her Phenergan 12.5 mg p.o. q. 6 hours p.r.n., number 120 with 3 refills. 6. She is to return in 2 weeks' time. 7. Will need hepatitis A and B vaccination through [facility].            Brooke Dare, MD   [phone]  D:  Thu Oct 04 15:20:53 2012 ; T:  Thu Oct 04 16:04:03 2012  Job #:  [id number]

## 2010-12-12 LAB — CBC WITH DIFFERENTIAL/PLATELET
Basophils Absolute: 0 10*3/uL (ref 0.0–0.1)
Lymphocytes Relative: 40 % (ref 12–46)
Lymphs Abs: 1.5 10*3/uL (ref 0.7–4.0)
Neutro Abs: 1.7 10*3/uL (ref 1.7–7.7)
Neutrophils Relative %: 46 % (ref 43–77)
Platelets: 165 10*3/uL (ref 150–400)
RBC: 2.99 MIL/uL — ABNORMAL LOW (ref 3.87–5.11)
RDW: 14.7 % (ref 11.5–15.5)
WBC: 3.6 10*3/uL — ABNORMAL LOW (ref 4.0–10.5)

## 2010-12-18 ENCOUNTER — Other Ambulatory Visit: Payer: Self-pay | Admitting: Gastroenterology

## 2010-12-18 LAB — CBC WITH DIFFERENTIAL/PLATELET
Basophils Absolute: 0 10*3/uL (ref 0.0–0.1)
Basophils Relative: 0 % (ref 0–1)
HCT: 29.2 % — ABNORMAL LOW (ref 36.0–46.0)
Hemoglobin: 8.9 g/dL — ABNORMAL LOW (ref 12.0–15.0)
Lymphocytes Relative: 32 % (ref 12–46)
Monocytes Absolute: 0.5 10*3/uL (ref 0.1–1.0)
Monocytes Relative: 12 % (ref 3–12)
Neutro Abs: 2.5 10*3/uL (ref 1.7–7.7)
Neutrophils Relative %: 55 % (ref 43–77)
WBC: 4.5 10*3/uL (ref 4.0–10.5)

## 2010-12-19 ENCOUNTER — Ambulatory Visit (INDEPENDENT_AMBULATORY_CARE_PROVIDER_SITE_OTHER): Payer: Self-pay | Admitting: Gastroenterology

## 2010-12-19 DIAGNOSIS — B182 Chronic viral hepatitis C: Secondary | ICD-10-CM

## 2010-12-25 ENCOUNTER — Other Ambulatory Visit: Payer: Self-pay | Admitting: Gastroenterology

## 2010-12-25 NOTE — Progress Notes (Signed)
NAMEMarland Kitchen  [name], [name]  MR#:  [id number]      DATE:  12/19/2010  DOB:  [birth year]    cc: Dow Adolph, MD, [facility], [phone] [address]. [address] [place], [place] [address], Fax No. [phone]    Referring physician:  Dow Adolph, MD, [address], [address], [place], [place] [address], Fax [phone]   REASON FOR VISIT:  Followup genotype 1B hepatitis C virus, week 8 of treatment.    History:  The patient returns today unaccompanied. She is due to take her eighth injection of Pegasys tomorrow. She reports compliance with all medications. Today, she still has some problems with anorexia, and her  weight is down another 3 pounds from previous.   It will be recalled that at the last clinic appointment I incised and drained and abscess developing on the tip of her right D3 finger and gave her a course of Keflex.  She reports she has 2 doses of the  Keflex left, but the finger was significantly better right after the incision and drainage and has completely resolved.   She complains of lightheadedness and being "dizzy."  She reports that her blood sugars are controlled.   PAST MEDICAL HISTORY:  Significant for type 2 diabetes.   CURRENT MEDICATIONS:  1. Novolin 20 units subcu daily. 2. Telaprevir 750 mg p.o. 3. Ribavirin, dose reduced to 400 mg a.m., 200 mg p.m. for anemia. 4. Pegasys 180 micrograms subcu weekly. 5. Metformin 500 mg b.i.d.  6. Januvia, dose unknown, daily.   Allergies:  Denies.    Smoking:  Never.    Alcohol:  Denies interval consumption.   REVIEW OF SYSTEMS:  All 10 systems reviewed today with the patient and are negative other than that which is mentioned above. Her CES-D was 15.   PHYSICAL EXAMINATION:   Constitutional:  The patient is well appearing. She was thin.  VITAL SIGNS:  Height 62 inches, weight 109 pounds, blood pressure 110/84, pulse 76, temperature 98 degrees Fahrenheit.   Laboratory data:  Previously from 12/18/2010 her  CBC showed  a white count of 4.5, ANC 2.5, hemoglobin 8.9, platelet count of 197.  Prior to this on 12/11/2010 her hemoglobin was 8.5.   Assessment:  The patient is a 50 year old woman with genotype 1b hepatitis C virus, IL28B CT, with liver biopsy 05/14/2010 showing grade 2 stage I disease. She commenced therapy on 10/27/2010. Her eighth dose of  Pegasys will be due tomorrow. Her viral load was not done at precisely week 4 due to problems with the lab.  Therefore, at approximately week 4-1/2 of therapy her viral load was detectable but not quantifiable.  She is tolerating therapy with the usual side effect of anemia.  The dose reduction of ribavirin should stabilize her hemoglobin without the use of erythropoietin hopefully.   In my discussion today with the patient we discussed her lab test results from previous, including her hemoglobin. She does not want to consider using erythropoietin injection.  We discussed possibly  a further dose reduction of ribavirin for the anemia. She also does not want to consider this because she is afraid to fall below 600 mg a day in case it would affect her chance of SVR.  She states that she can handle the anemia as it is.   Plan:  1. We will not change her dose of ribavirin, or Pegasys. 2. She is to continue with weekly CBCs. 3. I will see her again in 4 weeks' time to get her  week 12 HCV RNA done. 4. She is to remain on the same dose of ribavirin at 400 mg a.m. 200 mg p.m. with no change in telaprevir or Pegasys. 5. She is in need of hepatitis A and B vaccinations through [facility]. 6. Return in 4 weeks' time at week 12 for HCV RNA.            Brooke Dare, MD    [phone]  D:  Fri Oct 19 16:28:36 2012 ; T:  Sat Oct 20 13:16:39 2012  Job #:  [id number]

## 2010-12-26 LAB — CBC WITH DIFFERENTIAL/PLATELET
Basophils Absolute: 0 10*3/uL (ref 0.0–0.1)
Eosinophils Relative: 0 % (ref 0–5)
HCT: 28.2 % — ABNORMAL LOW (ref 36.0–46.0)
Hemoglobin: 8.5 g/dL — ABNORMAL LOW (ref 12.0–15.0)
Lymphocytes Relative: 37 % (ref 12–46)
Lymphs Abs: 1.5 10*3/uL (ref 0.7–4.0)
MCV: 95.6 fL (ref 78.0–100.0)
Monocytes Absolute: 0.7 10*3/uL (ref 0.1–1.0)
Monocytes Relative: 16 % — ABNORMAL HIGH (ref 3–12)
Neutro Abs: 2 10*3/uL (ref 1.7–7.7)
RBC: 2.95 MIL/uL — ABNORMAL LOW (ref 3.87–5.11)
RDW: 16.8 % — ABNORMAL HIGH (ref 11.5–15.5)
WBC: 4.2 10*3/uL (ref 4.0–10.5)

## 2011-01-01 ENCOUNTER — Other Ambulatory Visit: Payer: Self-pay | Admitting: Gastroenterology

## 2011-01-01 ENCOUNTER — Ambulatory Visit (INDEPENDENT_AMBULATORY_CARE_PROVIDER_SITE_OTHER): Payer: Self-pay | Admitting: Gastroenterology

## 2011-01-01 DIAGNOSIS — B182 Chronic viral hepatitis C: Secondary | ICD-10-CM

## 2011-01-01 LAB — CBC WITH DIFFERENTIAL/PLATELET
Eosinophils Absolute: 0 10*3/uL (ref 0.0–0.7)
HCT: 30.3 % — ABNORMAL LOW (ref 36.0–46.0)
Hemoglobin: 9.4 g/dL — ABNORMAL LOW (ref 12.0–15.0)
Lymphs Abs: 1.5 10*3/uL (ref 0.7–4.0)
MCH: 29.7 pg (ref 26.0–34.0)
Monocytes Absolute: 0.8 10*3/uL (ref 0.1–1.0)
Monocytes Relative: 16 % — ABNORMAL HIGH (ref 3–12)
Neutrophils Relative %: 53 % (ref 43–77)
RBC: 3.17 MIL/uL — ABNORMAL LOW (ref 3.87–5.11)

## 2011-01-08 ENCOUNTER — Other Ambulatory Visit: Payer: Self-pay | Admitting: Gastroenterology

## 2011-01-08 LAB — CBC WITH DIFFERENTIAL/PLATELET
Eosinophils Relative: 1 % (ref 0–5)
HCT: 30.1 % — ABNORMAL LOW (ref 36.0–46.0)
Lymphocytes Relative: 34 % (ref 12–46)
Lymphs Abs: 1.6 10*3/uL (ref 0.7–4.0)
MCH: 29.1 pg (ref 26.0–34.0)
MCV: 97.4 fL (ref 78.0–100.0)
Monocytes Absolute: 0.6 10*3/uL (ref 0.1–1.0)
RBC: 3.09 MIL/uL — ABNORMAL LOW (ref 3.87–5.11)
RDW: 17.5 % — ABNORMAL HIGH (ref 11.5–15.5)
WBC: 4.7 10*3/uL (ref 4.0–10.5)

## 2011-01-08 NOTE — Progress Notes (Signed)
NAME:  [name], [name]  MR#:  [id number]      DATE:  01/01/2011  DOB:  [birth year]    cc: Primary Care Physician:  Same Referring Physician:  Dow Adolph, MD, [address], [place], [place] [address], Fax [phone]    REASON FOR VISIT:  Follow up genotype 1b hepatitis C virus week 10 of treatment.   History:  The patient returns today unaccompanied. She came in today without an appointment complaining of significant pruritic erythematous eruption over her abdomen and legs. It will be recalled that otherwise her  course of therapy he has been complicated by anemia requiring dose reduction of ribavirin. Since last being seen on 12/19/2010, the patient developed a significant  erythematous eruption over over her  flanks, anterior of the abdomen and her legs and elbows. This is significantly pruritic. She has only tried Vaseline and baby oil for this but did not try any antihistamine or topical over-the-counter  steroids. It reached such an intensity last night that she came in to be seen today without an appointment. Otherwise, she has no complaints.   PAST MEDICAL HISTORY:  Significant for type 2 diabetes.   CURRENT MEDICATIONS:  Novolin 20 units subcu daily, telaprevir 750 mg p.o. q. 8 hours, ribavirin 400 mg p.o. a.m., 200 mg p.o. q. p.m. dose reduced for  anemia, Pegasys 180 mcg subcu weekly, metformin 500 mg b.i.d. Januvia dose unknown daily.    Allergies:  Denies.   Habits:  Smoking, never. Alcohol, denies interval consumption.   REVIEW OF SYSTEMS:  All 10 systems reviewed today with the patient and they are negative other than which is mentioned above. She did not complete the CES-D today.   PHYSICAL EXAMINATION:   Constitutional:  Thin appearing but otherwise well. Vital signs: Height 62 inches, weight 110 pounds up a pound from previous. Blood pressure 125/95, pulse 102, temperature 97.1 Fahrenheit.  Dermatologic examination:  There is a large  erythematous plaque over the right flank just lateral to the right upper quadrant. In addition, there is erythema and dryness over all 4 quadrants of the abdomen.  There is similar erythematous plaques on the elbows and patches over the shins of the legs.   Laboratories:  On 12/25/2010, her hemoglobin was 8.5, MCV 95.6, platelet count of 163, white count 4.2, ANC 2.0.  Previously on 12/18/2010, hemoglobin was 8.9.    assessment:  The patient is a 50 year old woman with a history of genotype 1b hepatitis C, IL28B CT, liver biopsy in 05/14/2010 showing grade 2 stage I disease. She commenced therapy on 10/27/2010. She is currently  week 10 of HCV therapy. Her week 4 HCV RNA was not done precisely on time, but at approximately week 4-1/2, it was detectable but not quantifiable. In addition, her first week of treatment was compromised  by not taking her Pegasys. Today, the pruritic eruption she comes in with is probably from the telaprevir although I cannot exclude the possibility of interferon mediated psoriasis over the right flank, and  her elbows, and legs. In addition, she appears to have an injection site reaction in her abdomen, and did not think about rotating injection sites to her legs. I think this can be easily handled with  antihistamines and a topical steroid for the plaque-like eruptions and rotating injection sites to legs for the erythematous eruptions on her abdomen.  The majority of the clinic appointment was spent discussing the diagnosis and instructing her on how to rotate injection  sites to her legs, as well as use the topical steroid and antihistamine.   PLAN:  1. CBC today, is already done. 2. She will need hepatitis A and B vaccination through [facility]. 3. For the complaints of skin rash I am giving her Allegra 180 mg p.o. daily with 30 tablets with 5 refills. 4. For the skin rash, I have also given her triamcinolone 0.1% cream, apply to affected areas b.i.d., 15 g, 5  refills. 5. She is to continue with weekly CBC. 6. She is to return in 2 weeks' time for her week 12 HCV RNA.            Brooke Dare, MD   ADDENDUM  HgB 9.4 on 01/01/11 , improving.  [phone] .[id number]  D:  Thu Nov 01 19:20:57 2012 ; T:  Thu Nov 01 21:45:36 2012  Job #:  [id number]

## 2011-01-15 ENCOUNTER — Ambulatory Visit (INDEPENDENT_AMBULATORY_CARE_PROVIDER_SITE_OTHER): Payer: Self-pay | Admitting: Gastroenterology

## 2011-01-15 DIAGNOSIS — B182 Chronic viral hepatitis C: Secondary | ICD-10-CM

## 2011-01-15 LAB — HEPATIC FUNCTION PANEL
Albumin: 4.2 g/dL (ref 3.5–5.2)
Alkaline Phosphatase: 57 U/L (ref 39–117)
Indirect Bilirubin: 0.3 mg/dL (ref 0.0–0.9)
Total Bilirubin: 0.4 mg/dL (ref 0.3–1.2)
Total Protein: 6.7 g/dL (ref 6.0–8.3)

## 2011-01-15 LAB — CBC WITH DIFFERENTIAL/PLATELET
HCT: 33.3 % — ABNORMAL LOW (ref 36.0–46.0)
Hemoglobin: 9.8 g/dL — ABNORMAL LOW (ref 12.0–15.0)
Lymphocytes Relative: 25 % (ref 12–46)
Lymphs Abs: 1.2 10*3/uL (ref 0.7–4.0)
MCHC: 29.4 g/dL — ABNORMAL LOW (ref 30.0–36.0)
Monocytes Absolute: 0.6 10*3/uL (ref 0.1–1.0)
Monocytes Relative: 12 % (ref 3–12)
Neutro Abs: 3.1 10*3/uL (ref 1.7–7.7)
Neutrophils Relative %: 62 % (ref 43–77)
RBC: 3.32 MIL/uL — ABNORMAL LOW (ref 3.87–5.11)

## 2011-01-15 LAB — TSH: TSH: 1.825 u[IU]/mL (ref 0.350–4.500)

## 2011-01-19 LAB — HEPATITIS C RNA QUANTITATIVE

## 2011-01-21 ENCOUNTER — Encounter: Payer: Self-pay | Admitting: Gastroenterology

## 2011-01-21 NOTE — Progress Notes (Signed)
NAME:  [name], [name]  MR#:  [id number]      DATE:  01/15/2011  DOB:  November 01, 1960    cc: Referring Physician:  Dow Adolph, MD, [facility] [address] [place], [place] [address], Fax [phone]    REASON FOR VISIT:  Follow up of genotype 1 B hepatitis C virus at week 12 of treatment.    History:  The patient returns today unaccompanied. The rash over her abdomen and  lower back and legs continues but is not as bad as before. She is using the triamcinolone that I have given her in addition to the over  the counter fexofenadine with improvement. The injection site rash is also somewhat better though present, as well. Other than that, she only complains of dry skin over her hands with cracking. 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 Other than those complaints, she has no complaints  referable to treatment. She is aware of the fact that she is anemic but wants to proceed with as much ribavirin as she will tolerate.   PAST MEDICAL HISTORY:  Significant for type 2 diabetes. She reports her blood sugars are well controlled.   CURRENT MEDICATIONS:  Novolin 20 units subcu daily, telaprevir 750 mg p.o. q. 8 hours, ribavirin 400 mg a.m. and 200 mg p.m. dose reduced for anemia, Pegasys 180 mcg weekly, metformin 100 mg b.i.d., Januvia dose unknown daily.    Allergies:  Denies.   Habits:  Smoking never. Alcohol denies interval consumption.   REVIEW OF SYSTEMS:  All 10 systems reviewed today with the patient and they are negative other which was mentioned above. CES-D was 8.   Physical examination:   Constitutional:  Thin appearing but otherwise well. Vital signs: Height 62 inches, weight 112 pounds up 2 pounds from previous. Blood pressure 110/90, pulse of 89, temperature 97.9 Fahrenheit.  Her dermatologic exam, in my estimation the erythematous plaque over the right flank is about the same as before, as is the the rash over   all 4 quadrants of the abdomen, similarly the erythematous plaques on the elbows and shins, and patches of legs seem about the same to me.   Laboratories:  Lab work from 01/08/2011, hemoglobin was 9, which is an improvement from previous, which was 8.5.   Assessment:  The patient is a 50 year old woman with a history genotype 1b hepatitis C, IL28B CT, liver biopsy 05/14/2010, showing grade 2 stage I disease. She commenced therapy with PEG interferon, ribavirin and  telaprevir on 10/27/2010. She is currently week 12 of therapy. Her week 4 HCV RNA was not done precisely on time.  It was done at approximately week 4-1/2 and was detectable but not quantifiable. She is due for a week 12 HCV RNA today.   It should be remembered that her first week of therapy was compromised not taking her Pegasys. The Pegasys drug reaction looks about the same as well as the skin rash over her torso and her legs that is likely due to telaprevir and looks about the same, certainly not worse. She is willing to tolerate these to continue with therapy. Furthermore it should be noted that this will largely resolve when the telaprevir is discontinued.  The majority of clinic appointment today was spent discussing the management of the rash and the importance of rotating her injection sites for the skin rash over her abdomen. We discussed also using  Vaseline  with cotton gloves at night for her hands, which I think is unrelated to the rash from her medications.   plan:  1. CBC, liver enzymes, and week 12 HCV RNA done today. 2. She will need hepatitis A and B through [facility]. 3. She is to continue with her medication at current dosing including the ribavirin dose reduction at 600 mg daily for her anemia. 4. Continue with weekly CBCs. 5. Return in 4 weeks' time.            Brooke Dare, MD   ADDENDUM  Week 12 HCV RNA undetectable.  Can continue on treatment,  but because her HCV RNA was detectable at week 4, will  need a full 48 weeks of treatment.  [id number] .[id number]  D:  Thu Nov 15 14:58:27 2012 ; T:  Thu Nov 15 15:51:09 2012  Job #:  [id number]

## 2011-01-26 ENCOUNTER — Other Ambulatory Visit: Payer: Self-pay | Admitting: Gastroenterology

## 2011-01-26 LAB — CBC WITH DIFFERENTIAL/PLATELET
Basophils Relative: 0 % (ref 0–1)
Eosinophils Absolute: 0 10*3/uL (ref 0.0–0.7)
Eosinophils Relative: 1 % (ref 0–5)
HCT: 30.4 % — ABNORMAL LOW (ref 36.0–46.0)
Hemoglobin: 8.9 g/dL — ABNORMAL LOW (ref 12.0–15.0)
MCH: 29.1 pg (ref 26.0–34.0)
MCHC: 29.3 g/dL — ABNORMAL LOW (ref 30.0–36.0)
Monocytes Absolute: 0.7 10*3/uL (ref 0.1–1.0)
Monocytes Relative: 13 % — ABNORMAL HIGH (ref 3–12)

## 2011-02-06 ENCOUNTER — Other Ambulatory Visit: Payer: Self-pay | Admitting: Gastroenterology

## 2011-02-06 LAB — CBC WITH DIFFERENTIAL/PLATELET
Eosinophils Absolute: 0 10*3/uL (ref 0.0–0.7)
Eosinophils Relative: 1 % (ref 0–5)
Lymphs Abs: 1.7 10*3/uL (ref 0.7–4.0)
MCH: 30.3 pg (ref 26.0–34.0)
MCV: 100.7 fL — ABNORMAL HIGH (ref 78.0–100.0)
Platelets: 168 10*3/uL (ref 150–400)
RBC: 3 MIL/uL — ABNORMAL LOW (ref 3.87–5.11)
RDW: 16.1 % — ABNORMAL HIGH (ref 11.5–15.5)

## 2011-02-12 ENCOUNTER — Ambulatory Visit (INDEPENDENT_AMBULATORY_CARE_PROVIDER_SITE_OTHER): Payer: Self-pay | Admitting: Gastroenterology

## 2011-02-12 DIAGNOSIS — B182 Chronic viral hepatitis C: Secondary | ICD-10-CM

## 2011-02-13 LAB — CBC WITH DIFFERENTIAL/PLATELET
Basophils Absolute: 0 10*3/uL (ref 0.0–0.1)
Basophils Relative: 0 % (ref 0–1)
Eosinophils Relative: 0 % (ref 0–5)
HCT: 35.2 % — ABNORMAL LOW (ref 36.0–46.0)
MCHC: 31.3 g/dL (ref 30.0–36.0)
Monocytes Absolute: 0.6 10*3/uL (ref 0.1–1.0)
Neutro Abs: 3.1 10*3/uL (ref 1.7–7.7)
RDW: 15.5 % (ref 11.5–15.5)

## 2011-02-13 LAB — HEPATIC FUNCTION PANEL
ALT: 12 U/L (ref 0–35)
Albumin: 4.2 g/dL (ref 3.5–5.2)
Bilirubin, Direct: 0.1 mg/dL (ref 0.0–0.3)
Total Protein: 6.9 g/dL (ref 6.0–8.3)

## 2011-02-19 NOTE — Progress Notes (Signed)
   NAME:  Teresa, Spencer  MR#:  [id number]      DATE:  02/12/2011  DOB:  December 31, 1960    cc:  Referring Physician: Dow Adolph, MD, [facility] [address], [place], [place] [address], Fax [phone]    REASON FOR VISIT:  Follow up of genotype 1b hepatitis C virus, on therapy at week 16.    HISTORY:  Patient returns today unaccompanied. She reports that approximately 7 days ago she was seen in urgent care for "boils" in her right axilla, for which she was given a course of antibiotics. The rash over her  abdomen and legs is about the same as before, but it is not distressing to her at this time.  She denies any significant psychiatric symptoms. She is concerned about continued weight loss, although she has only lost approximately 2 pounds from before, having gained those pounds from the visit prior  to last.  She knows to have stopped her telaprevir on week 12 as instructed.   PAST MEDICAL HISTORY:  There is no change, but is otherwise significant for type 2 diabetes.   CURRENT MEDICATIONS:  1. Novolin 20 units subcutaneously daily. 2. Ribavirin 400 mg a.m., 200 mg p.m., dose reduced for anemia. 3. Pegasys 180 micrograms weekly. 4. Metformin 1000 mg b.i.d.  5. Januvia, dose unknown, daily.   Allergies:  Denies.     habits:   Smoking:  Never.   Alcohol:  Denies interval consumption.   REVIEW OF SYSTEMS:  All 10 systems were reviewed today with the patient and are negative other than that which is mentioned above. Her CES-D was not completed today.   PHYSICAL EXAMINATION:  Constitutional:  Well-appearing without stigmata of chronic liver disease but thin. Vital signs: Height 62 inches, weight 110 pounds. Blood pressure 152/95 pulse of 77, temperature 97.4 Fahrenheit.     LABORATORY WORK:  From 02/06/2011, hemoglobin was 9.1, white count 4.6, ANC 2.1, and platelets of 168.   ASSESSMENT:  Patient is a 50 year old woman with genotype 1b hepatitis C, IL28B CT, with  liver biopsy 05/14/2010 showing grade 2 stage I disease.  She commenced therapy with pegylated interferon and ribavirin and  telaprevir on 10/27/2010. She is currently at week 16 of therapy. Her week 4 hepatitis C virus RNA was not done precisely on time, but at week 4.5 it was detectable but unquantifiable.  Her Week 12 hepatitis  C virus RNA was undetectable.  She has developed significant anemia on therapy but wishes to forego Procrit because she is afraid of side effects, having discussed this with her at least on 2 occasions including today.    In my discussion today with the patient, we reviewed her course of therapy to date. She was still very pleased that her week 12 hepatitis C virus RNA was negative.   PLAN:  1. CBC, liver enzymes, standard laboratories being done today. 2. She indicated to me that she had been contacted by [facility] for hepatitis A and B vaccinations. 3. Continue with weekly CBCs. 4. Return to clinic in 4 weeks' time, which is about week 20.            Brooke Dare, MD   ADDENDUM  HgB 11  [phone]  D:  Thu Dec 13 18:26:30 2012 ; T:  Fri Dec 14 17:57:21 2012  Job #:  [id number]

## 2011-02-20 ENCOUNTER — Other Ambulatory Visit: Payer: Self-pay | Admitting: Gastroenterology

## 2011-02-20 LAB — CBC WITH DIFFERENTIAL/PLATELET
Lymphocytes Relative: 28 % (ref 12–46)
Lymphs Abs: 1.5 10*3/uL (ref 0.7–4.0)
Neutro Abs: 3.1 10*3/uL (ref 1.7–7.7)
Neutrophils Relative %: 58 % (ref 43–77)
Platelets: 151 10*3/uL (ref 150–400)
RBC: 3.53 MIL/uL — ABNORMAL LOW (ref 3.87–5.11)
WBC: 5.4 10*3/uL (ref 4.0–10.5)

## 2011-02-27 ENCOUNTER — Other Ambulatory Visit: Payer: Self-pay | Admitting: Gastroenterology

## 2011-02-27 LAB — CBC WITH DIFFERENTIAL/PLATELET
Basophils Absolute: 0 10*3/uL (ref 0.0–0.1)
HCT: 34 % — ABNORMAL LOW (ref 36.0–46.0)
Hemoglobin: 10.3 g/dL — ABNORMAL LOW (ref 12.0–15.0)
Lymphocytes Relative: 30 % (ref 12–46)
Monocytes Absolute: 0.8 10*3/uL (ref 0.1–1.0)
Monocytes Relative: 14 % — ABNORMAL HIGH (ref 3–12)
Neutro Abs: 3.2 10*3/uL (ref 1.7–7.7)
Neutrophils Relative %: 56 % (ref 43–77)
RDW: 13.6 % (ref 11.5–15.5)
WBC: 5.8 10*3/uL (ref 4.0–10.5)

## 2011-03-05 ENCOUNTER — Ambulatory Visit: Payer: Self-pay | Admitting: Gastroenterology

## 2011-03-05 ENCOUNTER — Other Ambulatory Visit: Payer: Self-pay | Admitting: Gastroenterology

## 2011-03-05 LAB — CBC WITH DIFFERENTIAL/PLATELET
Eosinophils Absolute: 0 10*3/uL (ref 0.0–0.7)
Hemoglobin: 10.2 g/dL — ABNORMAL LOW (ref 12.0–15.0)
Lymphocytes Relative: 29 % (ref 12–46)
Lymphs Abs: 1.2 10*3/uL (ref 0.7–4.0)
MCH: 29.8 pg (ref 26.0–34.0)
Monocytes Relative: 13 % — ABNORMAL HIGH (ref 3–12)
Neutrophils Relative %: 58 % (ref 43–77)
RBC: 3.42 MIL/uL — ABNORMAL LOW (ref 3.87–5.11)
WBC: 4.1 10*3/uL (ref 4.0–10.5)

## 2011-03-12 ENCOUNTER — Ambulatory Visit (INDEPENDENT_AMBULATORY_CARE_PROVIDER_SITE_OTHER): Payer: Self-pay | Admitting: Gastroenterology

## 2011-03-12 DIAGNOSIS — B182 Chronic viral hepatitis C: Secondary | ICD-10-CM

## 2011-03-12 LAB — CBC WITH DIFFERENTIAL/PLATELET
Eosinophils Relative: 1 % (ref 0–5)
HCT: 34.8 % — ABNORMAL LOW (ref 36.0–46.0)
Hemoglobin: 10.7 g/dL — ABNORMAL LOW (ref 12.0–15.0)
Lymphocytes Relative: 26 % (ref 12–46)
Lymphs Abs: 1.4 10*3/uL (ref 0.7–4.0)
MCV: 101.2 fL — ABNORMAL HIGH (ref 78.0–100.0)
Monocytes Absolute: 0.7 10*3/uL (ref 0.1–1.0)
Monocytes Relative: 13 % — ABNORMAL HIGH (ref 3–12)
Neutro Abs: 3.2 10*3/uL (ref 1.7–7.7)
RBC: 3.44 MIL/uL — ABNORMAL LOW (ref 3.87–5.11)
RDW: 13.4 % (ref 11.5–15.5)
WBC: 5.3 10*3/uL (ref 4.0–10.5)

## 2011-03-12 LAB — HEPATIC FUNCTION PANEL
ALT: 14 U/L (ref 0–35)
AST: 17 U/L (ref 0–37)
Albumin: 4.2 g/dL (ref 3.5–5.2)
Alkaline Phosphatase: 47 U/L (ref 39–117)
Total Protein: 6.8 g/dL (ref 6.0–8.3)

## 2011-03-12 NOTE — Progress Notes (Signed)
NAME: [name], [name]  MR#: [id number]      DATE: 03/12/2011 DOB: February 02, 1961    cc:  Referring Physician: Dow Adolph, MD, [facility] [address], [address], [place], [place] [address], Fax [phone]    REASON FOR VISIT:  Follow up of genotype 1b hepatitis C virus, on therapy at week 16.   HISTORY:  Patient returns today unaccompanied. She continues to be concerned about her weight loss.  Her weight is down by five pounds today.  However, she is eating and has an appetite.   [name] is also concerned about hair loss.  She denies any significant psychiatric symptoms.   PAST MEDICAL HISTORY:  There is no change, but is otherwise significant for type 2 diabetes.   CURRENT MEDICATIONS:  1. Novolin 20 units subcutaneously daily. 2. Ribavirin 400 mg a.m., 200 mg p.m., dose reduced for anemia. 3. Pegasys 180 micrograms weekly. 4. Metformin 1000 mg b.i.d.  5. Januvia, dose unknown, daily.   ALLERGIES:  Denies.   HABITS:  Smoking: Never.  Alcohol: Denies interval consumption.   REVIEW OF SYSTEMS:  All 10 systems were reviewed today with the patient and are negative other than that which is mentioned above. Her CES-D was not completed today.   PHYSICAL EXAMINATION:  Constitutional: Well-appearing without stigmata of chronic liver disease but thin. Vital signs: Height 62 inches, weight 106 pounds. Blood pressure 143/92, pulse of 86, temperature 97.3 Fahrenheit.   LABORATORY WORK:  Reviewed from Epic.  On 03/05/11, HgB was 10.2.  ASSESSMENT:  Patient is a 51 year old woman with genotype 1b hepatitis C, IL28B CT, with liver biopsy 05/14/2010 showing grade 2 stage I disease. She commenced therapy with pegylated interferon and ribavirin and telaprevir on 10/27/2010. She is currently at week 20 (rounded up) of therapy. Her week 4 hepatitis C virus RNA was not done precisely on time, but at week 4.5 it was detectable but unquantifiable. Her Week 12 hepatitis C virus RNA was undetectable.   Though, she is IL28B CT, with her kinetics, she would qualify for response guided therapy.  This would obviously help her concern about her weight loss, as I suspect her weight loss is due to the interferon.  Today we discussed using her kinetics for response guided therapy to get her off treatment earlier.  She would be in favour of that.  PLAN:  1. CBC, liver enzymes, TSH being done today. 2. She indicated to me that [facility] will see her in  February for hepatitis A and B vaccinations. 3. Reduced CBCs to every other week. 4. Return to clinic in 4 weeks' time, which is about week 24, at which time will draw another HCV RNA and discuss stopping therapy.  Brooke Dare, MD

## 2011-03-26 ENCOUNTER — Other Ambulatory Visit: Payer: Self-pay | Admitting: Gastroenterology

## 2011-03-27 LAB — CBC WITH DIFFERENTIAL/PLATELET
Basophils Relative: 0 % (ref 0–1)
Eosinophils Absolute: 0 10*3/uL (ref 0.0–0.7)
Eosinophils Relative: 0 % (ref 0–5)
MCH: 29.7 pg (ref 26.0–34.0)
MCHC: 29.1 g/dL — ABNORMAL LOW (ref 30.0–36.0)
Neutrophils Relative %: 42 % — ABNORMAL LOW (ref 43–77)
Platelets: 93 10*3/uL — ABNORMAL LOW (ref 150–400)
RDW: 13.3 % (ref 11.5–15.5)

## 2011-04-09 ENCOUNTER — Ambulatory Visit (INDEPENDENT_AMBULATORY_CARE_PROVIDER_SITE_OTHER): Payer: Self-pay | Admitting: Gastroenterology

## 2011-04-09 DIAGNOSIS — B182 Chronic viral hepatitis C: Secondary | ICD-10-CM

## 2011-04-10 LAB — CBC WITH DIFFERENTIAL/PLATELET
Basophils Absolute: 0 10*3/uL (ref 0.0–0.1)
Eosinophils Absolute: 0 10*3/uL (ref 0.0–0.7)
Eosinophils Relative: 0 % (ref 0–5)
HCT: 36.1 % (ref 36.0–46.0)
Lymphs Abs: 1.4 10*3/uL (ref 0.7–4.0)
MCH: 29.4 pg (ref 26.0–34.0)
MCV: 98.4 fL (ref 78.0–100.0)
Monocytes Absolute: 0.8 10*3/uL (ref 0.1–1.0)
Platelets: 113 10*3/uL — ABNORMAL LOW (ref 150–400)
RDW: 13.1 % (ref 11.5–15.5)

## 2011-04-10 LAB — HEPATIC FUNCTION PANEL
ALT: 12 U/L (ref 0–35)
Albumin: 4.6 g/dL (ref 3.5–5.2)
Alkaline Phosphatase: 53 U/L (ref 39–117)
Total Protein: 7.3 g/dL (ref 6.0–8.3)

## 2011-04-13 LAB — HEPATITIS C RNA QUANTITATIVE: HCV Quantitative: NOT DETECTED IU/mL (ref <43)

## 2011-04-16 ENCOUNTER — Encounter: Payer: Self-pay | Admitting: Gastroenterology

## 2011-04-16 NOTE — Progress Notes (Signed)
NAMEMarland Kitchen  [name], [name]  MR#:  [id number]      DATE:  04/09/2011  DOB:  [birth year]    cc: Dorena Cookey, MD, [place], [address], [place], [place] [address], fax number [phone].    REASON FOR VISIT:  Follow up of genotype 1 B hepatitis C on therapy at week 24.   History:  The patient returns today unaccompanied. She has lost another 3 pounds since last being seen. However, she is eating and has an appetite. She feels significantly better off the Telaprevir.   PAST MEDICAL HISTORY:  No change but is significant for type 2 diabetes. She reports she continues to develop "boils" in her axillae.   CURRENT MEDICATIONS:  Novolin 20 units subcu daily, Januvia, dose unknown daily, metformin 1000 mg b.i.d., ribavirin 400 mg a.m., 200 mg p.m. dose reduced for anemia, Pegasys 180 mcg weekly.    Allergies:  Denies.   HABITS:  Smoking, never. Alcohol, denies interval consumption.   REVIEW OF SYSTEMS:  All 10 systems reviewed today with the patient negative, other than which is mentioned above. She did not complete the CES-D.    PHYSICAL EXAMINATION:   Constitutional:  Thin appearing but otherwise well.   Vital signs:  Height 62 inches, weight 103 pounds, blood pressure 164/109, pulse 100, temperature 98.1 Fahrenheit.   ASSESSMENT:  Patient is a 51 year old woman with a history of genotype 1B hepatitis C, IL 28 B CT, liver biopsy on 05/14/2010, showing grade 2 stage I disease. She commenced therapy with Pegasys, ribavirin and Telaprevir  on 10/27/2010. This puts her at week approximately 24 of therapy. Her week 4 HCV RNA was not done precisely on time, but  at week 4.5, it was detectable but unquantifiable. Her week 12 HCV RNA was  undetectable. Because she had a detectable HCV RNA at week 4.5, despite my previous notes she will not qualify for response guided therapy and will need to complete the full 48 weeks of treatment. This  is provided that she continues to  tolerate therapy. Weight loss is as expected with some patients on interferon.  Today we discussed the results of her lab testing and its implication that she needs to continue on therapy until 48 weeks.  In my discussion today with the patient,  I discussed the nature and natural history of HCV.  We discussed the role of a liver biopsy for genotype 1 HCV, if the patient is genotype 1 HCV.  We discussed  treatment with pegylated interferon and ribavirin.  I discussed response rates and our treatment protocol for our clinic.  I reviewed the specific systems, constitutional, and psychiatric side effects of  therapy.  I emphasized any potential side effects to reflect the patient's past medical history.  I have told the patient that she must not share objects exposed to blood such as toothbrushes and razors.  I  told the patient that HCV is so rarely sexually transmitted such that it would not require changing sexual practices.  I have discussed the teratogenicity of the ribavirin.     PLAN:  1. CBC with TSH today. 2. Will draw her week 24 HCV RNA today and the importance of this was reinforced. 3. She indicates to me that she still trying to get into [facility] to have her hepatitis A and B vaccinations completed but they have contacted her regarding this. 4. She is to continue the CBCs every other week. 5. She is to return to see me  in 4 weeks' time at week approximately 28.            Brooke Dare, MD   ADDENDUM:  HCV RNA at week 24 undetectable.  [phone]  D:  Thu Feb 07 20:30:37 2013 ; T:  Sat Feb 09 10:29:46 2013  Job #:  [id number]

## 2011-04-19 ENCOUNTER — Emergency Department (HOSPITAL_COMMUNITY)
Admission: EM | Admit: 2011-04-19 | Discharge: 2011-04-19 | Disposition: A | Discharge status: Home / Self Care | Payer: Self-pay | Attending: Emergency Medicine | Admitting: Emergency Medicine

## 2011-04-19 ENCOUNTER — Encounter (HOSPITAL_COMMUNITY): Payer: Self-pay | Admitting: *Deleted

## 2011-04-19 DIAGNOSIS — J069 Acute upper respiratory infection, unspecified: Secondary | ICD-10-CM | POA: Insufficient documentation

## 2011-04-19 DIAGNOSIS — Z794 Long term (current) use of insulin: Secondary | ICD-10-CM | POA: Insufficient documentation

## 2011-04-19 DIAGNOSIS — R05 Cough: Secondary | ICD-10-CM | POA: Insufficient documentation

## 2011-04-19 DIAGNOSIS — R059 Cough, unspecified: Secondary | ICD-10-CM | POA: Insufficient documentation

## 2011-04-19 DIAGNOSIS — I10 Essential (primary) hypertension: Secondary | ICD-10-CM | POA: Insufficient documentation

## 2011-04-19 DIAGNOSIS — Z79899 Other long term (current) drug therapy: Secondary | ICD-10-CM | POA: Insufficient documentation

## 2011-04-19 DIAGNOSIS — R509 Fever, unspecified: Secondary | ICD-10-CM | POA: Insufficient documentation

## 2011-04-19 DIAGNOSIS — E119 Type 2 diabetes mellitus without complications: Secondary | ICD-10-CM | POA: Insufficient documentation

## 2011-04-19 DIAGNOSIS — J3489 Other specified disorders of nose and nasal sinuses: Secondary | ICD-10-CM | POA: Insufficient documentation

## 2011-04-19 HISTORY — DX: Essential (primary) hypertension: I10

## 2011-04-19 HISTORY — DX: Unspecified viral hepatitis C without hepatic coma: B19.20

## 2011-04-19 NOTE — ED Notes (Signed)
Reports having flu like symptoms since Friday, was unable to work and now needs a work note. Denies any n/v/d no distress noted at triage.

## 2011-04-19 NOTE — ED Provider Notes (Signed)
History     CSN: [id number]  Arrival date & time 04/19/11  1345   First MD Initiated Contact with Patient 04/19/11 1405      Chief Complaint  Patient presents with  . Influenza    (Consider location/radiation/quality/duration/timing/severity/associated sxs/prior treatment) Patient is a 51 y.o. female presenting with flu symptoms. The history is provided by the patient.  Influenza   the patient presents to the ER requesting a note stating she can return to work.  She had nasal congestion cough and low-grade fevers 3 days ago and she spoke with her manager at work who requests a note from a physician stating it is safe for her to return.  She reports resolution of fever and chills.  She has no nausea or vomiting.  She denies chest pain or abdominal pain.  She reports no cough.  She reports her nasal congestion is improving.  She would like to return to work.  Past Medical History  Diagnosis Date  . Hepatitis C   . Hypertension   . Diabetes mellitus     History reviewed. No pertinent past surgical history.  History reviewed. No pertinent family history.  History  Substance Use Topics  . Smoking status: Never Smoker   . Smokeless tobacco: Not on file  . Alcohol Use: No    OB History    Grav Para Term Preterm Abortions TAB SAB Ect Mult Living                  Review of Systems  All other systems reviewed and are negative.    Allergies  Review of patient's allergies indicates no known allergies.  Home Medications   Current Outpatient Rx  Name Route Sig Dispense Refill  . INSULIN ASPART 100 UNIT/ML [place] SOLN Subcutaneous Inject 20 Units into the skin every morning. Patient only uses in the am.    . METFORMIN HCL 500 MG PO TABS Oral Take 500 mg by mouth 2 (two) times daily with a meal.    . SITAGLIPTIN PHOSPHATE 25 MG PO TABS Oral Take 25 mg by mouth daily.      BP 131/91  Pulse 95  Temp(Src) 98.1 F (36.7 C) (Oral)  Resp 18  SpO2 99%  Physical Exam  Nursing  note and vitals reviewed. Constitutional: She is oriented to person, place, and time. She appears well-developed and well-nourished. No distress.  HENT:  Head: Normocephalic and atraumatic.  Eyes: EOM are normal.  Neck: Normal range of motion.  Cardiovascular: Normal rate, regular rhythm and normal heart sounds.   Pulmonary/Chest: Effort normal and breath sounds normal.  Abdominal: Soft. She exhibits no distension. There is no tenderness.  Musculoskeletal: Normal range of motion.  Neurological: She is alert and oriented to person, place, and time.  Skin: Skin is warm and dry.  Psychiatric: She has a normal mood and affect. Judgment normal.    ED Course  Procedures (including critical care time)  Labs Reviewed - No data to display No results found.   1. Upper respiratory tract infection       MDM  The patient appears to be through her illness.  She was given a work note stating she can return to work        Lyanne Co, MD 04/19/11 [phone]

## 2011-04-24 ENCOUNTER — Other Ambulatory Visit: Payer: Self-pay | Admitting: Gastroenterology

## 2011-04-25 LAB — CBC WITH DIFFERENTIAL/PLATELET
Basophils Absolute: 0 10*3/uL (ref 0.0–0.1)
Basophils Relative: 0 % (ref 0–1)
Eosinophils Absolute: 0 10*3/uL (ref 0.0–0.7)
Hemoglobin: 10.1 g/dL — ABNORMAL LOW (ref 12.0–15.0)
MCH: 29.4 pg (ref 26.0–34.0)
MCHC: 29.9 g/dL — ABNORMAL LOW (ref 30.0–36.0)
Monocytes Relative: 17 % — ABNORMAL HIGH (ref 3–12)
Neutro Abs: 2.1 10*3/uL (ref 1.7–7.7)
Neutrophils Relative %: 46 % (ref 43–77)
Platelets: 109 10*3/uL — ABNORMAL LOW (ref 150–400)
RDW: 13 % (ref 11.5–15.5)

## 2011-05-07 ENCOUNTER — Ambulatory Visit (INDEPENDENT_AMBULATORY_CARE_PROVIDER_SITE_OTHER): Payer: Self-pay | Admitting: Gastroenterology

## 2011-05-07 DIAGNOSIS — B182 Chronic viral hepatitis C: Secondary | ICD-10-CM

## 2011-05-07 NOTE — Progress Notes (Signed)
NAMEMarland Kitchen Teresa, Spencer  MR#: [id number]      DATE: 05/07/2011  DOB: 1960-10-23    cc:  Teresa Cookey, MD, [facility], [address] [place], [place] [address], fax number [phone].    REASON FOR VISIT:  Follow up of genotype 1 B hepatitis C on therapy at week 27.   HISTORY:  The patient returns today unaccompanied. She has lost another 2 pounds since last being seen. However, she is eating and has an appetite, as always. She has no new complaints.   PAST MEDICAL HISTORY:  No change but is significant for type 2 diabetes.  She was pleased that her A1C was less than 6 %, until I explained to her that her treatment induced anemia will artificially reduce her A1C.  CURRENT MEDICATIONS:  Novolin 20 units subcu daily, Januvia dose unknown daily, metformin 1000 mg b.i.d., ribavirin 400 mg a.m., 200 mg p.m. dose reduced for anemia, Pegasys 180 mcg weekly.   ALLERGIES:  Denies.   HABITS:  Smoking, never. Alcohol, denies interval consumption.   REVIEW OF SYSTEMS:  All 10 systems reviewed today with the patient negative, other than which is mentioned above. She did not complete the CES-D 6.  PHYSICAL EXAMINATION:  Constitutional: Thin appearing but otherwise well.  Vital signs: Height 62 inches, weight 101 pounds, blood pressure 135/107, pulse 111, temperature 98.1 Fahrenheit.   LABORATORY:  CBC from 04/24/11 reviewed,  ASSESSMENT:  [name] is a 51 year old woman with a history of genotype 1B hepatitis C, IL 28 B CT, liver biopsy on 05/14/2010, showing grade 2 stage I disease. She commenced therapy with Pegasys, ribavirin and Telaprevir  on 10/27/2010. This puts her at week approximately 27 of therapy. Her week 4 HCV RNA was not done precisely on time, but at week 4.5, it was detectable but unquantifiable. Her week 12 HCV RNA was  Undetectable, as was her week 24 HCV RNA.  Because she had a detectable HCV RNA at week 4.5, despite my previous notes she will not qualify  for response guided therapy and will need to complete the full 48 weeks of treatment. This  is provided that she continues to tolerate therapy. Weight loss is as expected with some patients on interferon.  Today we discussed the results of her lab testing.  We also discussed her weight loss.  We discussed dose reduction in her Pegasys to 135 mcg weekly for her weight loss, but she would like to persist with the current dose.  PLAN:  1. She indicates to me that she still trying to get into [facility] to have her hepatitis A and B vaccinations completed but they are unable to do so because of cost, and she cannot afford the vaccines. 2. Will stop the between visit labs. 3. She is to return to see me in 4 weeks' time at week approximately 31.  Brooke Dare, MD

## 2011-06-05 ENCOUNTER — Ambulatory Visit (INDEPENDENT_AMBULATORY_CARE_PROVIDER_SITE_OTHER): Payer: Self-pay | Admitting: Gastroenterology

## 2011-06-05 DIAGNOSIS — B182 Chronic viral hepatitis C: Secondary | ICD-10-CM

## 2011-06-05 LAB — HEPATIC FUNCTION PANEL
ALT: 18 U/L (ref 0–35)
AST: 19 U/L (ref 0–37)
Albumin: 4.2 g/dL (ref 3.5–5.2)
Alkaline Phosphatase: 57 U/L (ref 39–117)
Indirect Bilirubin: 0.2 mg/dL (ref 0.0–0.9)
Total Protein: 6.8 g/dL (ref 6.0–8.3)

## 2011-06-05 MED ORDER — LIDOCAINE VISCOUS 2 % MT SOLN: 20.0000 mL | OROMUCOSAL | Status: AC | PRN

## 2011-06-05 NOTE — Progress Notes (Signed)
NAMEMarland Spencer CLAIRA, JETER  MR#: [id number]      DATE: 06/05/2011  DOB: November 03, 1960    cc:  Teresa Cookey, MD, [facility], [address], [place], [place] [address], fax number [phone].    REASON FOR VISIT:  Follow up of genotype 1 B hepatitis C on therapy at week 31.   HISTORY:  The patient returns today unaccompanied. She has not lost any more weight since last being seen. She is eating and has an appetite, as always. Ms. [name] has no new complaints related to her liver disease or therapy.   PAST MEDICAL HISTORY:  Significant for type 2 diabetes.  Hydrochlorothiazide was added to her telmisartan for her hypertension.  She was started on pravastatin since her last appointment.  Ms [name] also reports being given Septra SS BID 7 days ago for another infection in one of her axillae.   Ms [name] stopped the Septra four doses short of completion with then swelling at the infection site resolved and she started developing ulcerations at the edges of her tongue.  She thinks her dentures may be rubbing against her tongue, and thinks she does not have thrush.  Ms. [name] has temporarily relief rinsing with peroxide twice a day but still has pain around her tongue.  CURRENT MEDICATIONS:  Novolin 20 units subcu daily, Januvia dose unknown daily, metformin 1000 mg b.i.d., ribavirin 400 mg a.m., 200 mg p.m. dose reduced for anemia, Pegasys 180 mcg weekly, telmisartan/hctz 40/12.5 mg daily, she also started pravastatin 20 mg daily since last seen,  She was but is no longer taking Septra 160/800 mg BID.  ALLERGIES:  Denies.   HABITS:  Smoking, never. Alcohol, denies interval consumption.   REVIEW OF SYSTEMS:  All 10 systems reviewed today with the patient negative, other than which is mentioned above. She did not complete the CES-D 6.   PHYSICAL EXAMINATION:  Constitutional: Thin appearing but otherwise well.  Vital signs: Height 62 inches, weight 101 pounds, blood pressure 123/96,  pulse 98, temperature 97.1 Fahrenheit.  Ears, Nose, Mouth, and Throat:  Aphthous ulcers on the lateral aspects of the tongue bilaterally.  No thrush on the palate or tongue.   LABORATORY:  CBC from 04/24/11 reviewed.  ASSESSMENT:  Ms. [name] is a 51 year old woman with a history of genotype 1B hepatitis C, IL 28 B CT, liver biopsy on 05/14/2010, showing grade 2 stage I disease. She commenced therapy with Pegasys, ribavirin and Telaprevir  on 10/27/2010. This puts her at week approximately 31 of therapy. Her week 4 HCV RNA was not done precisely on time, but at week 4.5, it was detectable but unquantifiable. Her week 12 HCV RNA was  undetectable, as was her week 24 HCV RNA. Because she had a detectable HCV RNA at week 4.5, despite my previous notes she will not qualify for response guided therapy and will need to complete the full 48 weeks of treatment. This is provided that she continues to tolerate therapy.  The mouth ulcers may be due, as the patient says, to her dentures rubbing on her tongue.  It does not look like thrush or lichen planus.  Today we discussed the results of her lab testing.  I discussed using viscous lidocaine 2% with the peroxide twice daily.  PLAN:  1. She indicates to me that she still trying to get into [facility] to have her hepatitis A and B vaccinations completed but they are unable to do so because of cost, and she cannot afford the  vaccines. 2. Standard labs. 3. Viscous lidocaine 2% 20 mL with peroxide BID 200 mL 2 refills. 4. She is to return to see me in 4 weeks' time.  Brooke Dare, MD

## 2011-06-06 LAB — CBC WITH DIFFERENTIAL/PLATELET
Basophils Relative: 0 % (ref 0–1)
Eosinophils Absolute: 0 10*3/uL (ref 0.0–0.7)
Eosinophils Relative: 0 % (ref 0–5)
Hemoglobin: 10 g/dL — ABNORMAL LOW (ref 12.0–15.0)
Lymphs Abs: 1.1 10*3/uL (ref 0.7–4.0)
MCH: 27.6 pg (ref 26.0–34.0)
MCHC: 29.8 g/dL — ABNORMAL LOW (ref 30.0–36.0)
MCV: 92.8 fL (ref 78.0–100.0)
Monocytes Absolute: 0.6 10*3/uL (ref 0.1–1.0)
Monocytes Relative: 17 % — ABNORMAL HIGH (ref 3–12)
RBC: 3.62 MIL/uL — ABNORMAL LOW (ref 3.87–5.11)

## 2011-07-02 ENCOUNTER — Ambulatory Visit (INDEPENDENT_AMBULATORY_CARE_PROVIDER_SITE_OTHER): Payer: Self-pay | Admitting: Gastroenterology

## 2011-07-02 DIAGNOSIS — B182 Chronic viral hepatitis C: Secondary | ICD-10-CM

## 2011-07-03 LAB — CBC WITH DIFFERENTIAL/PLATELET
Basophils Absolute: 0 10*3/uL (ref 0.0–0.1)
Basophils Relative: 0 % (ref 0–1)
Eosinophils Absolute: 0 10*3/uL (ref 0.0–0.7)
Hemoglobin: 10 g/dL — ABNORMAL LOW (ref 12.0–15.0)
MCH: 28.2 pg (ref 26.0–34.0)
MCHC: 30.4 g/dL (ref 30.0–36.0)
Monocytes Relative: 20 % — ABNORMAL HIGH (ref 3–12)
Neutrophils Relative %: 47 % (ref 43–77)
RDW: 14.6 % (ref 11.5–15.5)

## 2011-07-09 NOTE — Progress Notes (Signed)
NAME:  [name], [name]  MR#:  [id number]      DATE:  07/02/2011  DOB:  1960-11-21    cc: Referring Physician:  Dow Adolph, MD, [facility] [address], [address] [place], [place] [address], Fax [phone]    REASON FOR VISIT:  Follow up of genotype 1b hepatitis C on therapy at week 36.   HISTORY:  The patient returns today unaccompanied. She has lost another pound since last being seen. She reports she is eating well and has an appetite. She has no complaints referable to her history of hepatitis C nor to her therapy at this time.   PAST MEDICAL HISTORY:  Significant for type 2 diabetes. She reports her a.m. blood sugar today was 111.   CURRENT MEDICATIONS:  1. Novolin 20 units subcu daily.  2. Januvia, dose unknown daily.  3. Metformin 1000 mg b.i.d.  4. Ribavirin 400 mg a.m., 200 mg p.m., reduced for anemia. 5. Pegasys 180 mcg weekly. 6. Telmisartan/hydrochlorothiazide 40/12.5 mg daily.  7. Pravastatin 20 mg daily.   ALLERGIES:  Denies.   HABITS:  Smoking never. Alcohol denies interval consumption.   REVIEW OF SYSTEMS:  All 10 systems reviewed today with the patient and they are negative other than which is mentioned above. She did not complete the CES-D sheet today.   PHYSICAL EXAMINATION:  Constitutional: Thin but otherwise well. Vital Signs: Height 62 inches, weight 100 pounds, blood pressure 115/86, pulse 112. Temperature was not measured.   LABORATORIES:  Laboratory work from previously was reviewed.   ASSESSMENT:  The patient is a 51 year old woman with a history genotype 1b hepatitis C, IL28B CT, with biopsy 05/14/2010, showing grade 2 stage I disease. She commenced therapy with Pegasys, ribavirin, telaprevir on 10/27/2010. This puts her at approximately week 36 of treatment with more 12 weeks ago. Her week 4 HCV RNA was not done precisely on time, but at week 4.5 was detectable but not quantifiable. Her week 12 HCV RNA was undetectable as was her week 24 HCV  RNA. Because of the detectable week 4.5 HCV RNA, she will need a full 48 weeks of therapy, hence she needs 12 more weeks of treatment.   In my discussion today with the patient, we discussed the rationale for extending therapy for another 12 weeks. We discussed end of treatment care in August 2013.   PLAN:  1. CBC today.  2. She will need to get her hepatitis A and B vaccinations through [facility] as previously discussed.  3. She is to return in 4 weeks' time in followup.               Brooke Dare, MD   [phone]  D:  Thu May 02 18:11:10 2013 ; T:  Thu May 02 23:14:31 2013  Job #:  [id number]

## 2011-07-30 ENCOUNTER — Ambulatory Visit (INDEPENDENT_AMBULATORY_CARE_PROVIDER_SITE_OTHER): Payer: Self-pay | Admitting: Gastroenterology

## 2011-07-30 DIAGNOSIS — B182 Chronic viral hepatitis C: Secondary | ICD-10-CM

## 2011-08-03 LAB — CBC WITH DIFFERENTIAL/PLATELET
Basophils Absolute: 0 10*3/uL (ref 0.0–0.1)
Basophils Relative: 0 % (ref 0–1)
Eosinophils Absolute: 0 10*3/uL (ref 0.0–0.7)
Eosinophils Relative: 0 % (ref 0–5)
HCT: 32.9 % — ABNORMAL LOW (ref 36.0–46.0)
Hemoglobin: 10.7 g/dL — ABNORMAL LOW (ref 12.0–15.0)
Lymphocytes Relative: 31 % (ref 12–46)
Lymphs Abs: 1.6 10*3/uL (ref 0.7–4.0)
MCH: 29.2 pg (ref 26.0–34.0)
MCHC: 32.5 g/dL (ref 30.0–36.0)
MCV: 89.9 fL (ref 78.0–100.0)
Monocytes Absolute: 0.8 10*3/uL (ref 0.1–1.0)
Monocytes Relative: 16 % — ABNORMAL HIGH (ref 3–12)
Neutro Abs: 2.7 10*3/uL (ref 1.7–7.7)
Neutrophils Relative %: 53 % (ref 43–77)
Platelets: 142 10*3/uL — ABNORMAL LOW (ref 150–400)
RBC: 3.66 MIL/uL — ABNORMAL LOW (ref 3.87–5.11)
RDW: 14 % (ref 11.5–15.5)
WBC: 5.1 10*3/uL (ref 4.0–10.5)

## 2011-08-04 LAB — HEPATIC FUNCTION PANEL
ALT: 8 U/L (ref 0–35)
AST: 14 U/L (ref 0–37)
Albumin: 4.2 g/dL (ref 3.5–5.2)
Total Bilirubin: 0.4 mg/dL (ref 0.3–1.2)

## 2011-08-06 NOTE — Progress Notes (Signed)
NAME:  [name], [name]  MR#:  [id number]      DATE:  07/30/2011  DOB:  111/23/1962    cc: Referring Physician: Dow Adolph, MD, [facility] [address] [place], [place] [address], Fx [phone]    REASON FOR VISIT:  Follow up genotype 1b, hepatitis C on therapy at week 40.   HISTORY:  The patient returns today unaccompanied. She has lost another 3 pounds since last being seen and is somewhat concerned about that. She complains of anorexia, but no nausea, vomiting. She also is concerned that she is running low on her medication supply by [facility] and has not been approved to return there for renewal of her medications to be paid for by the [facility]. Otherwise, she has no complaint directly referable to her therapy for her hepatitis C. She is concerned about a rash just below her lower lip above her chin.    PAST MEDICAL HISTORY:  Significant for type 2 diabetes. She reports her blood sugars remained well controlled when she checks, and she continues to monitor on her glucometer.   CURRENT MEDICATIONS:  1. Novolin 20 units subcu daily.  2. Januvia, dose unknown daily.  3. Metformin 1000 mg b.i.d. 4. Ribavirin 400 mg a.m., 200 mg p.m. dose reduced for anemia. 5. Pegasys 180 mcg weekly. 6. Telmisartan/hydrochlorothiazide 3/12.5 mg daily.  7. Pravastatin 20 mg daily.   ALLERGIES:  Denies.   HABITS:  Smoking, never. Alcohol denies interval consumption.   REVIEW OF SYSTEMS:  All 10 systems reviewed today with the patient and they are negative other than which is mentioned above. Her CES-D was not completed.   PHYSICAL EXAMINATION:  Constitutional: Thin appearing but otherwise well and in good spirits. Vital signs: Height 62 inches, weight 97 pounds, blood pressure 117/91, pulse 97, temperature 97.6 Fahrenheit.   LABORATORIES:  Previous lab work was reviewed with the patient.   ASSESSMENT:  The patient is a 51 year old woman with history of genotype 1b, hepatitis C,  IL28B CT, with biopsy 05/14/2010, showing grade 2 stage I disease. She commenced therapy with Pegasys, ribavirin, telaprevir 10/27/2010. She is approximately week 40 of treatment with approximately 8 more weeks to go. Her week 4 HCV RNA was not done precisely on time, but at week 4.5, it was detectable but not quantifiable. Her week 12 and 24 HCV RNA were undetectable. She is committed to 48 weeks of therapy because of detectable HCV RNA at 4 week.   Weight loss is a concern of hers, but this is something I have certainly seen on treatment. We discussed the possibility of dose reduction of the interferon and response to this, and she declined this in the past. Her anemia has responded to dose reduction of ribavirin.   In my discussion today with the patient, we discussed the dose reduction of Pegasys, which she is now willing to do considering she is more concerned about her weight loss. We discussed going to [facility] to get her medications renewed. I offered to renew them for her here, but she would have to pay cash for them at retail pharmacy, which she cannot afford. She would contact [facility] again regarding this.   PLAN:  1. CBC, liver enzymes today.  2. Hepatitis A and B vaccination through [facility].   3. I have instructed her to dose reduce her Pegasys 135 mcg weekly from 180 and showed her how to do this on the demonstration kit.  The ribavirin will remain at  400 mg a.m., 200 mg p.m.  4. She is to return in 4 weeks' time in followup.               Brooke Dare, MD   [phone]  D:  Thu May 30 18:04:12 2013 ; T:  Thu May 30 20:12:24 2013  Job #:  [id number]

## 2011-08-27 ENCOUNTER — Ambulatory Visit (INDEPENDENT_AMBULATORY_CARE_PROVIDER_SITE_OTHER): Payer: Self-pay | Admitting: Gastroenterology

## 2011-08-27 DIAGNOSIS — B182 Chronic viral hepatitis C: Secondary | ICD-10-CM

## 2011-08-27 LAB — CBC WITH DIFFERENTIAL/PLATELET
Eosinophils Absolute: 0 10*3/uL (ref 0.0–0.7)
Eosinophils Relative: 0 % (ref 0–5)
HCT: 35.6 % — ABNORMAL LOW (ref 36.0–46.0)
Hemoglobin: 11.6 g/dL — ABNORMAL LOW (ref 12.0–15.0)
Lymphs Abs: 1.7 10*3/uL (ref 0.7–4.0)
MCH: 29.3 pg (ref 26.0–34.0)
MCV: 89.9 fL (ref 78.0–100.0)
Monocytes Relative: 16 % — ABNORMAL HIGH (ref 3–12)
RBC: 3.96 MIL/uL (ref 3.87–5.11)

## 2011-08-28 LAB — HEPATIC FUNCTION PANEL
Bilirubin, Direct: 0.1 mg/dL (ref 0.0–0.3)
Indirect Bilirubin: 0.3 mg/dL (ref 0.0–0.9)

## 2011-09-17 NOTE — Progress Notes (Signed)
NAMEMarland Kitchen  [name], [name]    MR#:  [id number]      DATE:  08/27/2011  DOB:  1960-06-11    cc: Referring Physician:  Dow Adolph, MD, [facility] [address], [address], [place], [place] [address], Fax [phone]    REASON FOR VISIT:  Follow up of genotype 1b hepatitis C on therapy at week 44.   HISTORY:  The patient returns today unaccompanied. She has maintained her weight since her last clinic appointment, possibly due to reduction in the dose in of pegylated interferon. She reports she is feeling better and her appetite is improved. There are no other symptoms referable to treatment of her hepatitis C nor are there symptoms to suggest cryoglobulin mediated or decompensated liver disease.   PAST MEDICAL HISTORY:  Significant for type 2 diabetes. She reports her blood sugar this morning was 87. She is satisfied with her blood sugar control. The patient has been able to get a supply of her medications through [facility], though she is undergoing the process of renewing her [facility] status.   CURRENT MEDICATIONS:  1. Novolin 20 units subcutaneously daily.  2. Januvia, dose unknown daily.  3. Metformin 1000 mg b.i.d.  4. Ribavirin 400 mg a.m., 200 mg p.m. dose reduced for anemia. 5. Pegasys 135 mcg weekly.   6. Telmisartan/hydrochlorothiazide 3/12.5 mg daily.  7. Pravastatin 20 mg daily.   ALLERGIES:  Denies.   HABITS:  Smoking never. Alcohol denies interval consumption.   REVIEW OF SYSTEMS:  All 10 systems reviewed today with the patient and they are negative other than which is mentioned above. Her CES-D was 13.   PHYSICAL EXAMINATION:  Constitutional: Well appearing, but thin. Vital Signs: Height 62 inches, weight 97 pounds, unchanged from previous. Blood pressure 100/75, pulse 75, temperature 97.9 Fahrenheit.   laboratories: Previous lab work was reviewed with the patient.   ASSESSMENT:  The patient is a 51 year old woman with history of genotype 1b hepatitis C, IL28B CT with  liver biopsy 05/14/2010, showing grade 2 stage I disease. She commenced therapy with Pegasys, ribavirin, and telaprevir 10/27/2010. She is approximately week 44 of treatment with 4 more weeks to go. Her week 4 HCV RNA was not done precisely on time but at week 4-1/2, it was detectable but not quantifiable. Her week 12 and 24 HCV RNA were undetectable. Because of the detectable HCV RNA at week 4, she is committed to 48 weeks of therapy. She is tolerating this much better in the last 4 weeks since the dose reduction of her interferon.   In my discussion today with the patient, I encouraged her to continue on treatment for the next 4 weeks. I discussed the protocol for discontinuation of the interferon and ribavirin at week 48 of therapy.   PLAN:  1. Liver enzymes today.  2. CBC today.  3. Hepatitis A and B vaccinations through [facility].  4. Follow up in 4 weeks' time.               Brooke Dare, MD   [phone]  D:  Thu Jun 27 17:36:40 2013 ; T:  Thu Jun 27 21:11:21 2013  Job #:  [id number]

## 2011-09-24 ENCOUNTER — Ambulatory Visit (INDEPENDENT_AMBULATORY_CARE_PROVIDER_SITE_OTHER): Payer: Self-pay | Admitting: Gastroenterology

## 2011-09-24 DIAGNOSIS — B182 Chronic viral hepatitis C: Secondary | ICD-10-CM

## 2011-09-24 LAB — HEPATIC FUNCTION PANEL
AST: 15 U/L (ref 0–37)
Albumin: 4.7 g/dL (ref 3.5–5.2)
Alkaline Phosphatase: 49 U/L (ref 39–117)
Total Bilirubin: 0.4 mg/dL (ref 0.3–1.2)
Total Protein: 7.5 g/dL (ref 6.0–8.3)

## 2011-10-01 ENCOUNTER — Encounter: Payer: Self-pay | Admitting: Gastroenterology

## 2011-10-01 NOTE — Progress Notes (Signed)
NAME:  [name], [name]  MR#:  [id number]      DATE:  09/24/2011  DOB:  1Apr 20, 1962    cc: M.D. Dow Adolph [facility], [address] [place], [place], [place] [address], Fax # [phone]    REASON FOR VISIT:  Follow up of genotype 1b hepatitis C, end of treatment visit.   HISTORY:  The patient returns today unaccompanied. She completed her last injection of Pegasys on 09/11/2011, and her last dose of ribavirin was on 09/19/2011, with her commencement date of 10/27/2010, with Pegasys, ribavirin, and telaprevir. She is feeling significantly better off treatment. She has gained a pound since her last appointment.   PAST MEDICAL HISTORY:  Significant for type 2 diabetes. She reports her blood sugars are well controlled.   CURRENT MEDICATIONS:  1. Novolin 20 units subcu daily.  2. Januvia, dose unknown daily.  3. Metformin 1000 mg b.i.d.  4. Telmisartan/hydrochlorothiazide 3/12.5 mg daily.  5. Pravastatin 20 mg daily.   ALLERGIES:  Denies.   HABITS:  Smoking: Never. Alcohol: Denies interval consumption.   REVIEW OF SYSTEMS:  All 10 systems reviewed today with the patient, and they are negative other than which is mentioned above. Her CES-D was 10.   PHYSICAL EXAMINATION:  Constitutional: Well appearing. Vitals signs: Height 62 inches, weight of 98.4 pounds, up 1.4 pounds from previous, blood pressure 147/86, pulse of 86, temperature 96.5 degrees Fahrenheit.   LABORATORIES:  Previous labs were reviewed with the patient.   ASSESSMENT:  The patient is a 51 year old woman with a history genotype 1b hepatitis C, IL28B CT with liver biopsy on 05/14/2010, showing grade 2, stage I disease. She commenced therapy with Pegasys, ribavirin, and telaprevir on 10/27/2010. She discontinued Pegasys on 09/11/2011, and ribavirin on 09/19/2011. Her week 4 HCV RNA was not done precisely on time, but at week 4.5, it was detectable but not quantifiable. Her weeks 12 and 24 HCV RNA were  undetectable. My hope is that she will have an SVR.   In my discussion today, we discussed obtaining labs from today to establish whether she is at an end of treatment response. We discussed followup, and I explained to her it would be more sufficient for her to come back in 6 months' time rather than have to return in 3 months.   PLAN:  1. Liver enzymes and HCV RNA today.  2. Hepatitis A and B vaccinations through [facility].  3. Follow up in 6 months' time at [facility].            Brooke Dare, MD   ADDENDUM:  No detectable level of HCV RNA.  ALT 9.    [phone]  D:  Thu Jul 25 18:35:37 2013 ; T:  Fri Jul 26 10:04:55 2013  Job #:  [id number]

## 2011-10-29 ENCOUNTER — Encounter: Payer: Self-pay | Admitting: Family Medicine

## 2011-10-29 ENCOUNTER — Ambulatory Visit (INDEPENDENT_AMBULATORY_CARE_PROVIDER_SITE_OTHER): Payer: Self-pay | Admitting: Family Medicine

## 2011-10-29 VITALS — BP 122/84 | HR 88 | Temp 98.1°F | Ht 62.0 in | Wt 101.6 lb

## 2011-10-29 DIAGNOSIS — Z8619 Personal history of other infectious and parasitic diseases: Secondary | ICD-10-CM

## 2011-10-29 DIAGNOSIS — E785 Hyperlipidemia, unspecified: Secondary | ICD-10-CM | POA: Insufficient documentation

## 2011-10-29 DIAGNOSIS — E119 Type 2 diabetes mellitus without complications: Secondary | ICD-10-CM | POA: Insufficient documentation

## 2011-10-29 DIAGNOSIS — I10 Essential (primary) hypertension: Secondary | ICD-10-CM

## 2011-10-29 LAB — POCT GLYCOSYLATED HEMOGLOBIN (HGB A1C): Hemoglobin A1C: 6.5

## 2011-10-29 NOTE — Progress Notes (Signed)
Patient ID: [name], female   DOB: 1960-10-11, 51 y.o.   MRN: [id number] Subjective: The patient is a 51 y.o. year old female who presents today for initial appointment.  1. Hep C: Patient has recently completed treatment and is in SVR.  F/U in 6 months with ID.  2. HTN: Patient on micardis hct with good response.  No CP/DOE/headaches/visual changes/LE edema.  Patient has occasional palpitations, these have been present for over a year.  She has ~2 months of her medication remaining.  3. HLD: On pravachol.  No myalgias.  Unsure of last LDL.  4. DM: Slightly non-standard regemin but reports BG in the 100-140 range both AM and PM.  No lows.  No problems with medications.  Patient's past medical, social, and family history were reviewed and updated as appropriate. History  Substance Use Topics  . Smoking status: Never Smoker   . Smokeless tobacco: Not on file  . Alcohol Use: No   Objective:  Filed Vitals:   10/29/11 1603  BP: 122/84  Pulse: 88  Temp: 98.1 F (36.7 C)   Gen: NAD HEENT: MMM, EOMI, PERRL CV: RRR, no murmurs Resp: CTABL Abd: SNTND, liver edge just palpable below costal margin Ext: No edema, 2+ pulses  Assessment/Plan:  Please also see individual problems in problem list for problem-specific plans.

## 2011-10-29 NOTE — Assessment & Plan Note (Signed)
No changes to current meds.  Will await records from [facility] prior to ordering any labs.

## 2011-10-29 NOTE — Patient Instructions (Signed)
It was great to meet you today! Please get in contact with the medication assistance program and find out if they will cover your [place]. Come back to see me in about 6 weeks so we can make any changes that we need to your medications before you run out.

## 2011-10-29 NOTE — Assessment & Plan Note (Signed)
No changes to current meds.  A1c today is 6.5.  Will await records from [facility] prior to ordering any labs.

## 2011-10-29 NOTE — Assessment & Plan Note (Signed)
No changes to current meds.  Will await records from [facility] prior to ordering any labs. 

## 2011-12-29 ENCOUNTER — Encounter: Payer: Self-pay | Admitting: Family Medicine

## 2011-12-29 ENCOUNTER — Ambulatory Visit (INDEPENDENT_AMBULATORY_CARE_PROVIDER_SITE_OTHER): Payer: Self-pay | Admitting: Family Medicine

## 2011-12-29 VITALS — BP 127/83 | HR 81 | Temp 98.2°F | Ht 62.0 in | Wt 105.0 lb

## 2011-12-29 DIAGNOSIS — M65849 Other synovitis and tenosynovitis, unspecified hand: Secondary | ICD-10-CM

## 2011-12-29 DIAGNOSIS — M65839 Other synovitis and tenosynovitis, unspecified forearm: Secondary | ICD-10-CM

## 2011-12-29 DIAGNOSIS — M65319 Trigger thumb, unspecified thumb: Secondary | ICD-10-CM

## 2011-12-29 MED ORDER — PRAVASTATIN SODIUM 20 MG PO TABS: 20.0000 mg | ORAL_TABLET | Freq: Every day | ORAL | Status: DC

## 2011-12-29 MED ORDER — METFORMIN HCL 500 MG PO TABS: 500.0000 mg | ORAL_TABLET | Freq: Two times a day (BID) | ORAL | Status: DC

## 2011-12-29 MED ORDER — TELMISARTAN-HCTZ 40-12.5 MG PO TABS: 1.0000 | ORAL_TABLET | Freq: Every day | ORAL | Status: DC

## 2011-12-29 MED ORDER — SITAGLIPTIN PHOSPHATE 100 MG PO TABS: 100.0000 mg | ORAL_TABLET | Freq: Every day | ORAL | Status: DC

## 2011-12-29 MED ORDER — MELOXICAM 7.5 MG PO TABS: 7.5000 mg | ORAL_TABLET | Freq: Every day | ORAL | Status: DC

## 2011-12-29 NOTE — Patient Instructions (Signed)
Call [phone] for an appointment with Dr. Denyse Amass. I am giving you refills on your medications to take to the drug store. I am also giving you a prescription for an anti-inflammatory medication to take for the next week.

## 2011-12-30 ENCOUNTER — Ambulatory Visit: Payer: Self-pay | Admitting: Family Medicine

## 2012-01-04 ENCOUNTER — Encounter: Payer: Self-pay | Admitting: Family Medicine

## 2012-01-04 DIAGNOSIS — M65319 Trigger thumb, unspecified thumb: Secondary | ICD-10-CM | POA: Insufficient documentation

## 2012-01-04 NOTE — Assessment & Plan Note (Signed)
Offered injection but patient not interested at this time.  Will rx Mobic to be taken every day and rec calling sports med to set up time for injection.

## 2012-01-04 NOTE — Progress Notes (Signed)
Patient ID: MATTEA SEGER, female   DOB: 02/24/1961, 51 y.o.   MRN: [id number] Subjective: The patient is a 51 y.o. year old female who presents today for f/u and finger sticking.  1. Medications: Pt now needing medication refills.  Has not been able to get in touch with [facility] program.  Currently DM is controlled with no problems with lows or highs.  2. Finger sticking: Right thumb has been having problems with swelling and pain at the base.  This has been going on for about a month now.  Nothing seems to make worse or better.  Also has a catching sensation in the finger and has problems moving it on occasion.  Has taken Motrin to no effect.  Patient's past medical, social, and family history were reviewed and updated as appropriate. History  Substance Use Topics  . Smoking status: Never Smoker   . Smokeless tobacco: Not on file  . Alcohol Use: No   Objective:  Filed Vitals:   12/29/11 1538  BP: 127/83  Pulse: 81  Temp: 98.2 F (36.8 C)   Gen: NAD Hand: Left thumb has some swelling and tenderness at base.  There is an obvious "catch" with flexion/extension and patient has swelling over the first pully  Assessment/Plan: Provide scripts for med refills.  Pt to take to pharmacy to price.  Also advised physically showing up at [facility] program office to find out how to proceed.  Please also see individual problems in problem list for problem-specific plans.

## 2012-01-13 ENCOUNTER — Ambulatory Visit: Payer: Self-pay | Admitting: Family Medicine

## 2012-02-10 ENCOUNTER — Encounter: Payer: Self-pay | Admitting: Family Medicine

## 2012-02-10 ENCOUNTER — Ambulatory Visit (INDEPENDENT_AMBULATORY_CARE_PROVIDER_SITE_OTHER): Payer: No Typology Code available for payment source | Admitting: Family Medicine

## 2012-02-10 VITALS — BP 138/96 | HR 89 | Ht 62.0 in | Wt 105.0 lb

## 2012-02-10 DIAGNOSIS — M65319 Trigger thumb, unspecified thumb: Secondary | ICD-10-CM

## 2012-02-10 DIAGNOSIS — M65839 Other synovitis and tenosynovitis, unspecified forearm: Secondary | ICD-10-CM

## 2012-02-10 NOTE — Patient Instructions (Addendum)
Thank you for coming in today. You have trigger thumb.  We tried an injection today.  If it is not getting better by the new year let me know and I will refer you to orthopedic surgery.  Call or go to the ER if you develop a large red swollen joint with extreme pain or oozing puss.

## 2012-02-10 NOTE — Progress Notes (Signed)
[name] is a 51 y.o. female who presents to [facility] today for evaluation of right trigger thumb.  Present now for several months.  She was evaluated the family practice Center and referred here for evaluation and management.  She notes continued painful nodule at the MCP with limitation of DIP flexion.  She denies any radiating pain weakness or numbness.  She has tried bracing and anti-inflammatories which have not been effective.    PMH reviewed. Diabetes hypertension hyperlipidemia History  Substance Use Topics  . Smoking status: Never Smoker   . Smokeless tobacco: Not on file  . Alcohol Use: No   ROS as above otherwise neg   Exam:  BP 138/96  Pulse 89  Ht 5\' 2"  (1.575 m)  Wt 105 lb (47.628 kg)  BMI 19.20 kg/m2 Gen: Well NAD MSK: Right trigger thumb. Firm mildly painful nodule at the volar aspect of right thumb MCP.  Pain at nodule with flexion of the DIP joint.   Normal thumb motion and hand motion otherwise.  Normal grip strength.  Normal capillary refill and pulses   Procedure: Right trigger thumb injection. Consent obtained and timeout performed. Discussed that this is a marginal procedure for this condition, and specifically the risk of hypopigmentation.  The volar thumb was cleaned with alcohol and cold spray applied.  5/8 inch 25-gauge needle was used to access the volar nodule.  The needle was retracted until the fluid was able to flow freely into the tendon sheath.  15 mg of Depo-Medrol and 0.5 milliliters of Marcaine were injected into the tendon sheath.  Patient tolerated the procedure well with no bleeding.

## 2012-02-10 NOTE — Assessment & Plan Note (Signed)
Trigger thumb.  Failed conservative management Trial of injection today.  If not improved refer for surgery.  Discussed warning signs or symptoms. Please see discharge instructions. Patient expresses understanding.

## 2012-04-07 ENCOUNTER — Other Ambulatory Visit: Payer: Self-pay | Admitting: Family Medicine

## 2012-04-07 MED ORDER — SITAGLIPTIN PHOSPHATE 100 MG PO TABS: 100.0000 mg | ORAL_TABLET | Freq: Every day | ORAL | Status: DC

## 2012-04-15 ENCOUNTER — Ambulatory Visit: Payer: No Typology Code available for payment source | Admitting: Family Medicine

## 2012-04-26 ENCOUNTER — Ambulatory Visit (INDEPENDENT_AMBULATORY_CARE_PROVIDER_SITE_OTHER): Payer: No Typology Code available for payment source | Admitting: Family Medicine

## 2012-04-26 ENCOUNTER — Encounter: Payer: Self-pay | Admitting: Family Medicine

## 2012-04-26 VITALS — BP 115/73 | HR 90 | Ht 62.0 in | Wt 108.4 lb

## 2012-04-26 DIAGNOSIS — E119 Type 2 diabetes mellitus without complications: Secondary | ICD-10-CM

## 2012-04-26 DIAGNOSIS — I10 Essential (primary) hypertension: Secondary | ICD-10-CM

## 2012-04-28 ENCOUNTER — Encounter: Payer: Self-pay | Admitting: Family Medicine

## 2012-04-29 NOTE — Progress Notes (Signed)
Patient ID: [name], female   DOB: 1960/08/26, 52 y.o.   MRN: [id number] Subjective: The patient is a 52 y.o. year old female who presents today for f/u.  1. DM: No hypoglycemia.  No visual changes, numbness, tingling, polyuria.   Lab Results  Component Value Date   HGBA1C 6.6 04/26/2012   2. HTN: No cp, sob, doe, LE edema  Patient's past medical, social, and family history were reviewed and updated as appropriate. History  Substance Use Topics  . Smoking status: Never Smoker   . Smokeless tobacco: Not on file  . Alcohol Use: No   Objective:  Filed Vitals:   04/26/12 1532  BP: 115/73  Pulse: 90   Gen: NAD CV: RRR Resp: CTABl Ext: DM foot exam performed.  Good sensation, 2+ pulses  Assessment/Plan:  Please also see individual problems in problem list for problem-specific plans.

## 2012-04-29 NOTE — Assessment & Plan Note (Addendum)
Good control.  Continue current meds.  Check microalbumin.

## 2012-04-29 NOTE — Assessment & Plan Note (Signed)
Well controlled.  Urine microalbumin negative so no need for tighter control.

## 2012-06-03 ENCOUNTER — Other Ambulatory Visit: Payer: Self-pay | Admitting: Family Medicine

## 2012-06-03 MED ORDER — TELMISARTAN-HCTZ 40-12.5 MG PO TABS: 1.0000 | ORAL_TABLET | Freq: Every day | ORAL | Status: DC

## 2012-08-10 ENCOUNTER — Ambulatory Visit: Payer: No Typology Code available for payment source | Admitting: Family Medicine

## 2012-08-16 ENCOUNTER — Ambulatory Visit (INDEPENDENT_AMBULATORY_CARE_PROVIDER_SITE_OTHER): Payer: No Typology Code available for payment source | Admitting: Family Medicine

## 2012-08-16 ENCOUNTER — Encounter: Payer: Self-pay | Admitting: Family Medicine

## 2012-08-16 VITALS — BP 111/74 | HR 85 | Temp 98.0°F | Ht 62.0 in | Wt 105.3 lb

## 2012-08-16 DIAGNOSIS — I10 Essential (primary) hypertension: Secondary | ICD-10-CM

## 2012-08-16 DIAGNOSIS — E119 Type 2 diabetes mellitus without complications: Secondary | ICD-10-CM

## 2012-08-16 DIAGNOSIS — M21939 Unspecified acquired deformity of unspecified forearm: Secondary | ICD-10-CM | POA: Insufficient documentation

## 2012-08-16 DIAGNOSIS — M25539 Pain in unspecified wrist: Secondary | ICD-10-CM

## 2012-08-16 DIAGNOSIS — G8929 Other chronic pain: Secondary | ICD-10-CM

## 2012-08-16 DIAGNOSIS — M21931 Unspecified acquired deformity of right forearm: Secondary | ICD-10-CM

## 2012-08-16 LAB — CBC WITH DIFFERENTIAL/PLATELET
HCT: 40.9 % (ref 36.0–46.0)
Hemoglobin: 13.5 g/dL (ref 12.0–15.0)
Lymphocytes Relative: 43 % (ref 12–46)
Lymphs Abs: 3.9 10*3/uL (ref 0.7–4.0)
MCHC: 33 g/dL (ref 30.0–36.0)
Monocytes Absolute: 0.6 10*3/uL (ref 0.1–1.0)
Monocytes Relative: 7 % (ref 3–12)
Neutro Abs: 4.5 10*3/uL (ref 1.7–7.7)
WBC: 9.1 10*3/uL (ref 4.0–10.5)

## 2012-08-16 LAB — COMPREHENSIVE METABOLIC PANEL
Albumin: 4.9 g/dL (ref 3.5–5.2)
BUN: 14 mg/dL (ref 6–23)
CO2: 28 mEq/L (ref 19–32)
Calcium: 10.5 mg/dL (ref 8.4–10.5)
Chloride: 104 mEq/L (ref 96–112)
Glucose, Bld: 125 mg/dL — ABNORMAL HIGH (ref 70–99)
Potassium: 3.9 mEq/L (ref 3.5–5.3)

## 2012-08-16 LAB — POCT GLYCOSYLATED HEMOGLOBIN (HGB A1C): Hemoglobin A1C: 6.3

## 2012-08-16 LAB — RHEUMATOID FACTOR: Rhuematoid fact SerPl-aCnc: 15 IU/mL — ABNORMAL HIGH (ref <=14)

## 2012-08-16 NOTE — Progress Notes (Signed)
Patient ID: [name], female   DOB: 1960/04/30, 52 y.o.   MRN: [id number] Subjective: The patient is a 52 y.o. year old female who presents today for followup.  1. Diabetes: Patient denies any hypoglycemia. Taking medications as prescribed. A1c again today and demonstrates good control.  2. Hypertension: No chest pain. Taking medications. Does not regularly check blood pressure at home.  3. Knots on Wrists: Been present now for several years. Heart intermittently mildly painful. No other joint swelling or pain. Patient was treated for trigger finger of the right at sports medicine about 6 months ago. At that time the patient was told that these were related to her diabetes and would require surgery to correct. She has not had any blood work or imaging of is performed.  Patient's past medical, social, and family history were reviewed and updated as appropriate. History  Substance Use Topics  . Smoking status: Never Smoker   . Smokeless tobacco: Not on file  . Alcohol Use: No   Objective:  Filed Vitals:   08/16/12 1410  BP: 111/74  Pulse: 85  Temp: 98 F (36.7 C)   Gen: No acute distress Extremities: There is a bony protuberance on the lateral aspect of each wrist. There is no overlying soft tissue swelling. There is no significant tenderness with palpation. No limitation of range of motion of the wrist although there is a medial deviation& in the left wrist at rest. There is no evidence of catching with any of her other fingers or thumbs.  Assessment/Plan:   Please also see individual problems in problem list for problem-specific plans.

## 2012-08-16 NOTE — Assessment & Plan Note (Signed)
Well controlled. Continue current medications  

## 2012-08-16 NOTE — Patient Instructions (Signed)
We are going to start with an x-ray of your right wrist and will decide, from there, if we need to get one on the left. We are going to get some blood work to make sure you don't have something like rheumatoid arthritis.

## 2012-08-16 NOTE — Assessment & Plan Note (Signed)
Continue current medications. 

## 2012-08-16 NOTE — Assessment & Plan Note (Signed)
Differential would include rheumatoid arthritis, osteoarthritis, or malignancy. I will obtain x-rays to rule out malignancy or any type of fracture. Blood work for ruling out rheumatoid arthritis.

## 2012-08-17 ENCOUNTER — Ambulatory Visit (HOSPITAL_COMMUNITY)
Admission: RE | Admit: 2012-08-17 | Discharge: 2012-08-17 | Disposition: A | Discharge status: Home / Self Care | Payer: No Typology Code available for payment source | Source: Ambulatory Visit | Attending: Family Medicine | Admitting: Family Medicine

## 2012-08-17 DIAGNOSIS — M21931 Unspecified acquired deformity of right forearm: Secondary | ICD-10-CM

## 2012-08-17 DIAGNOSIS — M25439 Effusion, unspecified wrist: Secondary | ICD-10-CM | POA: Insufficient documentation

## 2012-08-17 DIAGNOSIS — M25539 Pain in unspecified wrist: Secondary | ICD-10-CM | POA: Insufficient documentation

## 2012-08-17 DIAGNOSIS — G8929 Other chronic pain: Secondary | ICD-10-CM

## 2012-08-17 DIAGNOSIS — M21939 Unspecified acquired deformity of unspecified forearm: Secondary | ICD-10-CM | POA: Insufficient documentation

## 2012-08-17 LAB — ANA: Anti Nuclear Antibody(ANA): NEGATIVE

## 2012-08-27 ENCOUNTER — Telehealth: Payer: Self-pay | Admitting: Family Medicine

## 2012-08-27 DIAGNOSIS — M21939 Unspecified acquired deformity of unspecified forearm: Secondary | ICD-10-CM

## 2012-08-27 NOTE — Telephone Encounter (Signed)
Please let patient know that I want her to go to sports med for their input on what is going on with her wrists.  (Discussed this with Dr. Jennette Kettle and she thinks it would be appropriate to do so before moving to wrist MRI.)

## 2012-08-29 ENCOUNTER — Other Ambulatory Visit: Payer: Self-pay | Admitting: *Deleted

## 2012-08-29 MED ORDER — METFORMIN HCL 500 MG PO TABS: 500.0000 mg | ORAL_TABLET | Freq: Two times a day (BID) | ORAL | Status: DC

## 2012-08-29 NOTE — Telephone Encounter (Signed)
Related message,pt voiced understanding. [name] S  

## 2012-08-30 ENCOUNTER — Other Ambulatory Visit: Payer: Self-pay | Admitting: *Deleted

## 2012-08-30 MED ORDER — METFORMIN HCL 500 MG PO TABS: 500.0000 mg | ORAL_TABLET | Freq: Two times a day (BID) | ORAL | Status: DC

## 2012-09-07 ENCOUNTER — Ambulatory Visit (INDEPENDENT_AMBULATORY_CARE_PROVIDER_SITE_OTHER): Payer: No Typology Code available for payment source | Admitting: Sports Medicine

## 2012-09-07 ENCOUNTER — Encounter: Payer: Self-pay | Admitting: Sports Medicine

## 2012-09-07 VITALS — BP 125/83 | Ht 62.0 in | Wt 105.0 lb

## 2012-09-07 DIAGNOSIS — M659 Synovitis and tenosynovitis, unspecified: Secondary | ICD-10-CM

## 2012-09-07 DIAGNOSIS — M65839 Other synovitis and tenosynovitis, unspecified forearm: Secondary | ICD-10-CM

## 2012-09-07 NOTE — Patient Instructions (Addendum)
You have been scheduled for an appointment at [facility] with Dr. Stanford Scotland on July 24, at 9 am  Their office is located at [address] [place] [address]  [phone]

## 2012-09-07 NOTE — Progress Notes (Signed)
  Subjective:    Patient ID: [name], female    DOB: October 27, 1960, 52 y.o.   MRN: [id number]  HPI  This is a 52 yo female who presents with complaints of bilateral wrist pain and swelling.  Onset one year ago, worse recently.  Swelling is chronic and noted at base of thumb on both wrists, left > right.  No weakness.  Pain to palpation.  Has not been dropping things or loosing function of wrists/hands.  Works as a Passenger transport manager at [employer].  Denies injury.  Review of Systems  Constitutional: Negative for fever and chills.  Musculoskeletal: Positive for joint swelling and arthralgias. Negative for myalgias.  Skin: Negative for color change and wound.       Objective:   Physical Exam BP 125/83  Ht 5\' 2"  (1.575 m)  Wt 105 lb (47.628 kg)  BMI 19.2 kg/m2 Well-developed, well-nourished. No acute distress Examination of each wrist shows full range of motion. No effusions. Mild tenderness to palpation along the first extensor compartment with a positive Finkelstein's bilaterally. The first extensor compartment is quite indurated to palpation but not erythematous and not warm to touch. No tenderness in the anatomic. Good strength. Negative Tinel's over the carpal tunnel. Negative Phalen's.  Upper extremity above and below wrist unremarkable.  X-rays of the right wrist dated 08/16/2012 are reviewed. There is soft tissue swelling adjacent to the radial styloid but the underlying wrist bones are within normal limits.  MSK ultrasound of both wrists was performed. There is marked enlargement of the first extensor compartment appreciated both on long and short view. This area corresponds to the area of induration on physical exam. Findings are consistent with marked first compartment extensor tenosynovitis       Assessment & Plan:  Marked bilateral Extensor tendon tenosynovitis (1st compartment)  Due to severity of symptoms I recommended surgical consultation with one of the local hand specialists  here in town. If patient is needing surgery she would like to have this done over the summer. Further workup and treatment will be per the discretion of the hand surgeons. Followup with me when necessary.

## 2012-09-08 ENCOUNTER — Other Ambulatory Visit: Payer: Self-pay

## 2012-11-04 ENCOUNTER — Other Ambulatory Visit: Payer: Self-pay | Admitting: Family Medicine

## 2012-11-04 MED ORDER — OLMESARTAN MEDOXOMIL-HCTZ 20-12.5 MG PO TABS: 1.0000 | ORAL_TABLET | Freq: Every day | ORAL | Status: DC

## 2012-11-21 ENCOUNTER — Telehealth: Payer: Self-pay | Admitting: Family Medicine

## 2012-11-21 NOTE — Telephone Encounter (Signed)
Patient was informed that she's needing to call Dr Denyse Amass sports medicine to obtain office visit note/Thats  whom she was seen for her thumb.she voiced understanding. [name], [name]

## 2012-11-21 NOTE — Telephone Encounter (Signed)
Pt needs documentation showing your visits about her thumb and wrist and also the referral to Sports Medicine. Please advise when this is ready

## 2012-11-28 ENCOUNTER — Other Ambulatory Visit: Payer: Self-pay | Admitting: Family Medicine

## 2012-11-28 MED ORDER — PRAVASTATIN SODIUM 20 MG PO TABS: 20.0000 mg | ORAL_TABLET | Freq: Every day | ORAL | Status: DC

## 2012-12-06 ENCOUNTER — Other Ambulatory Visit: Payer: Self-pay | Admitting: Family Medicine

## 2012-12-19 ENCOUNTER — Telehealth: Payer: Self-pay | Admitting: Family Medicine

## 2012-12-19 NOTE — Telephone Encounter (Signed)
Pt called and would like a refill on her metformin called into the map program. jw

## 2012-12-20 NOTE — Telephone Encounter (Signed)
Spoke with patient and made an appointment to come in to meet new PCP on 11/4 @ 1:45pm. Patient has not been seen since 07/2012. I will send in 1 month of metformin to get her through to this appointment.

## 2012-12-26 ENCOUNTER — Telehealth: Payer: Self-pay | Admitting: Family Medicine

## 2012-12-26 ENCOUNTER — Other Ambulatory Visit: Payer: Self-pay | Admitting: Family Medicine

## 2012-12-26 MED ORDER — METFORMIN HCL 500 MG PO TABS: 500.0000 mg | ORAL_TABLET | Freq: Two times a day (BID) | ORAL | Status: DC

## 2012-12-26 NOTE — Telephone Encounter (Signed)
Needs metformin prescription sent to MAP program

## 2012-12-26 NOTE — Telephone Encounter (Signed)
Left message on patient voicemail also letting  her know Dr Paulina Fusi did call in RX and and if she had other questions or concerns to return my call. Tequan Redmon, [name]

## 2012-12-26 NOTE — Telephone Encounter (Signed)
Phoned in Rx to [facility] department, as this is not a MAP medication.  Co-Pay will be 6 dollars for 3 month supply, with 3 refills.    Thanks, Twana First. Paulina Fusi, DO of [facility] 12/26/2012, 11:16 AM

## 2013-01-03 ENCOUNTER — Encounter: Payer: Self-pay | Admitting: Family Medicine

## 2013-01-03 ENCOUNTER — Ambulatory Visit (INDEPENDENT_AMBULATORY_CARE_PROVIDER_SITE_OTHER): Payer: No Typology Code available for payment source | Admitting: Family Medicine

## 2013-01-03 VITALS — BP 128/82 | HR 64 | Temp 98.1°F | Ht 62.0 in | Wt 106.4 lb

## 2013-01-03 DIAGNOSIS — I10 Essential (primary) hypertension: Secondary | ICD-10-CM

## 2013-01-03 DIAGNOSIS — E119 Type 2 diabetes mellitus without complications: Secondary | ICD-10-CM

## 2013-01-03 DIAGNOSIS — E785 Hyperlipidemia, unspecified: Secondary | ICD-10-CM

## 2013-01-03 DIAGNOSIS — M810 Age-related osteoporosis without current pathological fracture: Secondary | ICD-10-CM

## 2013-01-03 NOTE — Assessment & Plan Note (Signed)
Well controlled, continue current regimen.  Will get BMET to evaluate electrolyte and kidney function when able.

## 2013-01-03 NOTE — Assessment & Plan Note (Signed)
Last Lipid Profile in 2010, will get repeat when fasting to evaluate for compliance.

## 2013-01-03 NOTE — Patient Instructions (Signed)
[name], it was very nice meeting you today.  We will see you back in about 6 months, at that time we can discuss colonoscopy, as well as repeating some lab work for your diabetes.  If you have any questions before that, please give [place] a call.  Thank you, Dr. Paulina Fusi

## 2013-01-03 NOTE — Progress Notes (Signed)
[name] is a 52 y.o. female who presents today for HTN, HLD, and DM II, controlled  HTN - Well controlled, compliant with medications.  Denies cough, edema, swelling, palpitations, chest pain, HA, blurred vision, dizziness.   HLD - Compliant with medication, denies myalgias  DM II Controlled - Compliant with insulin and Metformin.  Denies N/V/D or hypoglycemic events or feelings.    Past Medical History  Diagnosis Date  . Hepatitis C   . Hypertension   . Diabetes mellitus     History  Smoking status  . Never Smoker   Smokeless tobacco  . Not on file    Family History  Problem Relation Age of Onset  . Diabetes type II Mother   . Diabetes type II Father   . Stroke Daughter     Current Outpatient Prescriptions on File Prior to Visit  Medication Sig Dispense Refill  . insulin aspart (NOVOLOG) 100 UNIT/ML injection Inject 20 Units into the skin every morning. Patient only uses in the am.      . metFORMIN (GLUCOPHAGE) 500 MG tablet Take 1 tablet (500 mg total) by mouth 2 (two) times daily with a meal.  180 tablet  3  . olmesartan-hydrochlorothiazide (BENICAR HCT) 20-12.5 MG per tablet Take 1 tablet by mouth daily.  90 tablet  3  . pravastatin (PRAVACHOL) 20 MG tablet Take 1 tablet (20 mg total) by mouth daily.  30 tablet  11  . sitaGLIPtin (JANUVIA) 100 MG tablet Take 1 tablet (100 mg total) by mouth daily.  90 tablet  3   No current facility-administered medications on file prior to visit.    ROS: Per HPI.  All other systems reviewed and are negative.   Physical Exam Filed Vitals:   01/03/13 1406  BP: 128/82  Pulse: 64  Temp: 98.1 F (36.7 C)    Physical Examination: General appearance - alert, well appearing, and in no distress Neurological - No focal deficits Extremities - peripheral pulses normal, no pedal edema, no clubbing or cyanosis.  Foot exam normal B/L  Skin - normal coloration and turgor, no rashes, no suspicious skin lesions noted Cardio: RRR  Lab  Results  Component Value Date   HGBA1C 6.2 01/03/2013

## 2013-01-03 NOTE — Assessment & Plan Note (Signed)
Well-controlled, continue current regimen 

## 2013-01-03 NOTE — Assessment & Plan Note (Signed)
On Fosamax 70 mg q week, Vit D 2000 mg qd, and Calcium 600 mg BID.  Dexa in 09/2012, followed at [facility].

## 2013-04-24 ENCOUNTER — Other Ambulatory Visit: Payer: Self-pay | Admitting: *Deleted

## 2013-04-24 MED ORDER — INSULIN ASPART 100 UNIT/ML ~~LOC~~ SOLN: 20.0000 [IU] | Freq: Every morning | SUBCUTANEOUS | Status: DC

## 2013-05-01 ENCOUNTER — Telehealth: Payer: Self-pay | Admitting: Family Medicine

## 2013-05-01 NOTE — Telephone Encounter (Signed)
Ms. Nedra HaiLee called from a Lawyer's office and would like someone to call from our office to explain what some lab results were on 08/16/12. jw

## 2013-05-01 NOTE — Telephone Encounter (Signed)
Please advise. [name]  

## 2013-05-08 NOTE — Telephone Encounter (Signed)
Left message for a return call.[name] S  

## 2013-05-08 NOTE — Telephone Encounter (Signed)
Can we please get some more information?  Thanks, Twana FirstBryan R. Paulina FusiHess, DO [facility] 05/08/2013, 9:44 AM

## 2013-05-08 NOTE — Telephone Encounter (Signed)
Left message on layers voicemail to obtain a release of information signed before we could even talk to her.Thats HIPPA'Spencer  request.[name], [name]

## 2013-06-20 ENCOUNTER — Ambulatory Visit: Payer: No Typology Code available for payment source | Admitting: Sports Medicine

## 2013-07-13 ENCOUNTER — Encounter: Payer: Self-pay | Admitting: *Deleted

## 2013-07-13 ENCOUNTER — Other Ambulatory Visit: Payer: Self-pay | Admitting: *Deleted

## 2013-07-13 MED ORDER — SITAGLIPTIN PHOSPHATE 100 MG PO TABS: 100.0000 mg | ORAL_TABLET | Freq: Every day | ORAL | Status: DC

## 2013-07-13 NOTE — Progress Notes (Signed)
[facility].  PA form placed in provider for review.  Clovis Puamika L Martin, RN

## 2013-07-14 ENCOUNTER — Other Ambulatory Visit: Payer: Self-pay | Admitting: Family Medicine

## 2013-07-14 MED ORDER — SAXAGLIPTIN HCL 2.5 MG PO TABS: 2.5000 mg | ORAL_TABLET | Freq: Every day | ORAL | Status: DC

## 2013-07-14 NOTE — Progress Notes (Signed)
Onglyza sent in to pharmacy.    Thanks, Twana FirstBryan R. Paulina FusiHess, DO [facility] 07/14/2013, 2:08 PM

## 2013-07-14 NOTE — Progress Notes (Signed)
Discussed case with [employer], and they will cover Onglyza which is same class as Januvia.  Will switch her to 2.5 mg qd for this medication and approved for one yr.  Form filled out and needs to be faxed to pharmacy.  Thanks, Twana FirstBryan R. Paulina FusiHess, DO [facility] 07/14/2013, 2:06 PM

## 2013-07-20 ENCOUNTER — Ambulatory Visit (INDEPENDENT_AMBULATORY_CARE_PROVIDER_SITE_OTHER): Payer: No Typology Code available for payment source | Admitting: Family Medicine

## 2013-07-20 ENCOUNTER — Encounter: Payer: Self-pay | Admitting: Family Medicine

## 2013-07-20 VITALS — BP 140/81 | HR 73 | Temp 98.1°F | Ht 62.0 in | Wt 107.0 lb

## 2013-07-20 DIAGNOSIS — E119 Type 2 diabetes mellitus without complications: Secondary | ICD-10-CM

## 2013-07-20 DIAGNOSIS — L910 Hypertrophic scar: Secondary | ICD-10-CM

## 2013-07-20 DIAGNOSIS — I10 Essential (primary) hypertension: Secondary | ICD-10-CM

## 2013-07-20 LAB — POCT GLYCOSYLATED HEMOGLOBIN (HGB A1C): HEMOGLOBIN A1C: 6.2

## 2013-07-20 MED ORDER — TRIAMCINOLONE ACETONIDE 0.1 % EX CREA: 1.0000 "application " | TOPICAL_CREAM | Freq: Two times a day (BID) | CUTANEOUS | Status: AC

## 2013-07-20 MED ORDER — LISINOPRIL 20 MG PO TABS: 20.0000 mg | ORAL_TABLET | Freq: Every day | ORAL | Status: DC

## 2013-07-20 NOTE — Progress Notes (Signed)
[name] is a 53 y.o. female who presents today for HTN, HLD, and DM II, controlled  HTN - Well controlled, has not been taking Benicar for the past week because of cost.  Denies cough, edema, swelling, palpitations, chest pain, HA, blurred vision, dizziness.   HLD - Compliant with medication, denies myalgias  DM II Controlled - Compliant with Januvia and Metformin. Not been taking novolog because of trouble with the injections. Denies N/V/D or hypoglycemic events or feelings.   Keloid Scar - RLE at the knee, itches, and has tried hydrocortisone and lotion to the area with minimal effect. No spreading erythema, fever, chills, sweats, or streaking.    Past Medical History  Diagnosis Date  . Hepatitis C   . Hypertension   . Diabetes mellitus     History  Smoking status  . Never Smoker   Smokeless tobacco  . Not on file    Family History  Problem Relation Age of Onset  . Diabetes type II Mother   . Diabetes type II Father   . Stroke Daughter     Current Outpatient Prescriptions on File Prior to Visit  Medication Sig Dispense Refill  . alendronate (FOSAMAX) 70 MG tablet Take 70 mg by mouth once a week. Take one pill by mouth, once per week.  Nothing per mouth for > 30 minutes afterwards, sitting upright      . metFORMIN (GLUCOPHAGE) 500 MG tablet Take 1 tablet (500 mg total) by mouth 2 (two) times daily with a meal.  180 tablet  3  . pravastatin (PRAVACHOL) 20 MG tablet Take 1 tablet (20 mg total) by mouth daily.  30 tablet  11  . saxagliptin HCl (ONGLYZA) 2.5 MG TABS tablet Take 1 tablet (2.5 mg total) by mouth daily.  30 tablet  11  . Vitamin D, Ergocalciferol, (DRISDOL) 50000 UNITS CAPS capsule Take 2,000 Units by mouth daily. 1 tab PO qd       No current facility-administered medications on file prior to visit.    ROS: Per HPI.  All other systems reviewed and are negative.   Physical Exam Filed Vitals:   07/20/13 1447  BP: 140/81  Pulse: 73  Temp: 98.1 F (36.7  C)    Physical Examination: General appearance - alert, well appearing, and in no distress Neurological - No focal deficits Extremities - peripheral pulses normal, no pedal edema, no clubbing or cyanosis.   Skin - + plaque 2 x 2 cm on the RLE distal to the patella  Cardio: RRR, no murmurs noted   Lab Results  Component Value Date   HGBA1C 6.2 07/20/2013   BMET    Component Value Date/Time   NA 141 08/16/2012 1458   K 3.9 08/16/2012 1458   CL 104 08/16/2012 1458   CO2 28 08/16/2012 1458   GLUCOSE 125* 08/16/2012 1458   BUN 14 08/16/2012 1458   CREATININE 0.78 08/16/2012 1458   CREATININE 0.90 09/02/2010 1822   CALCIUM 10.5 08/16/2012 1458   GFRNONAA >60 09/02/2010 1822   GFRAA >60 09/02/2010 1822

## 2013-07-20 NOTE — Assessment & Plan Note (Signed)
Keloid of the R shin, will try some kenalog cream BID for two weeks and reassess.

## 2013-07-20 NOTE — Assessment & Plan Note (Signed)
Well controlled, has not taken novolog.  Recommend just staying on metformin and Onglyza for now.  F/U in three months.

## 2013-07-20 NOTE — Patient Instructions (Signed)
Ms. [name], it was nice seeing you today.  Please take the lisinopril as directed and start using the kenalog for your leg two times per day for 14 days.  If improvement, continue for another two weeks.  At that time I will see you back.  Thanks, Dr. Paulina FusiHess

## 2013-07-20 NOTE — Assessment & Plan Note (Signed)
Unable to afford Benicar, will switch to Lisinopril.  F/U in 6 months and consider repeat BMET.

## 2013-07-21 ENCOUNTER — Telehealth: Payer: Self-pay | Admitting: Family Medicine

## 2013-07-21 NOTE — Telephone Encounter (Signed)
Pt called because she is unsure what to do with the new medication. Plus she is also unsure what the medications are for and if she is supposed stop taking other medications that she has previously. Please call to discuss since she has not taken any of her medications today. jw

## 2013-08-31 ENCOUNTER — Other Ambulatory Visit: Payer: Self-pay | Admitting: *Deleted

## 2013-08-31 MED ORDER — PRAVASTATIN SODIUM 20 MG PO TABS: 20.0000 mg | ORAL_TABLET | Freq: Every day | ORAL | Status: DC

## 2013-08-31 NOTE — Telephone Encounter (Signed)
Request for 90 day supply. Ranya Fiddler L, RN  

## 2013-09-04 ENCOUNTER — Other Ambulatory Visit: Payer: Self-pay | Admitting: *Deleted

## 2013-09-04 MED ORDER — PRAVASTATIN SODIUM 20 MG PO TABS: 20.0000 mg | ORAL_TABLET | Freq: Every day | ORAL | Status: DC

## 2014-02-02 ENCOUNTER — Other Ambulatory Visit: Payer: Self-pay | Admitting: Family Medicine

## 2014-02-02 NOTE — Telephone Encounter (Signed)
Needs appointment prior to next fill

## 2014-02-13 ENCOUNTER — Ambulatory Visit: Payer: No Typology Code available for payment source | Admitting: Family Medicine

## 2014-02-26 ENCOUNTER — Ambulatory Visit: Payer: No Typology Code available for payment source | Admitting: Family Medicine

## 2014-03-06 ENCOUNTER — Ambulatory Visit (INDEPENDENT_AMBULATORY_CARE_PROVIDER_SITE_OTHER): Payer: 59 | Admitting: Family Medicine

## 2014-03-06 ENCOUNTER — Other Ambulatory Visit: Payer: Self-pay | Admitting: Family Medicine

## 2014-03-06 ENCOUNTER — Encounter: Payer: Self-pay | Admitting: Family Medicine

## 2014-03-06 VITALS — BP 140/88 | HR 84 | Temp 97.7°F | Ht 62.0 in | Wt 100.4 lb

## 2014-03-06 DIAGNOSIS — Z0000 Encounter for general adult medical examination without abnormal findings: Secondary | ICD-10-CM | POA: Insufficient documentation

## 2014-03-06 DIAGNOSIS — E785 Hyperlipidemia, unspecified: Secondary | ICD-10-CM

## 2014-03-06 DIAGNOSIS — I10 Essential (primary) hypertension: Secondary | ICD-10-CM

## 2014-03-06 DIAGNOSIS — E119 Type 2 diabetes mellitus without complications: Secondary | ICD-10-CM

## 2014-03-06 LAB — COMPREHENSIVE METABOLIC PANEL
ALBUMIN: 4.5 g/dL (ref 3.5–5.2)
ALT: 8 U/L (ref 0–35)
AST: 13 U/L (ref 0–37)
Alkaline Phosphatase: 57 U/L (ref 39–117)
BUN: 15 mg/dL (ref 6–23)
CHLORIDE: 106 meq/L (ref 96–112)
CO2: 30 meq/L (ref 19–32)
Calcium: 10.2 mg/dL (ref 8.4–10.5)
Creat: 1.09 mg/dL (ref 0.50–1.10)
GLUCOSE: 114 mg/dL — AB (ref 70–99)
Potassium: 4.1 mEq/L (ref 3.5–5.3)
Sodium: 145 mEq/L (ref 135–145)
TOTAL PROTEIN: 7.2 g/dL (ref 6.0–8.3)
Total Bilirubin: 0.3 mg/dL (ref 0.2–1.2)

## 2014-03-06 LAB — POCT GLYCOSYLATED HEMOGLOBIN (HGB A1C): HEMOGLOBIN A1C: 6.5

## 2014-03-06 NOTE — Assessment & Plan Note (Signed)
Well controlled on repeat and home BP is well controlled - Continue lisinopril 20 mg.  If need, can increase to 40 or addition of HCTZ or norvasc - Check BMET today

## 2014-03-06 NOTE — Assessment & Plan Note (Signed)
Continue on Pravachol.   - Check Lipid Panel today - Consider increasing to higher dose statin - Consider addition of ASA for primary/secondary prevention of CAD

## 2014-03-06 NOTE — Assessment & Plan Note (Signed)
Discussion with mammogram, PAP, and Colonoscopy - Paper given for both mammogram and Colonoscopy.  Will continue to follow and discuss with pt - For PAP will have come back in about one month to complete.  Discussed benefits of having this done with HPV testing - Discussed risks and benefits of having testing completed or continuing to delay.  Pt understands risks and benefits.

## 2014-03-06 NOTE — Progress Notes (Signed)
[name] is a 54 y.o. female who presents today for HTN, HLD, and DM II/controlled, preventative care.   HTN - Well controlled.  Takes BP 1-2 x weekly, usually runs around 120/80 per her reports.  Denies cough, edema, swelling, palpitations, chest pain, HA, blurred vision, dizziness.   HLD - Compliant with medication, denies myalgias.   DM II Controlled - Compliant with Onglyza and Metformin. Denies N/V/D or hypoglycemic events or feelings.    Preventative Care - Has not had mammogram in last 2 years, PAP in last 5, or had previous colonoscopy.  She did have a TDap about 6-7 yrs ago per her report.  Eye doctor she saw in August 2015.     Past Medical History  Diagnosis Date  . Hepatitis C   . Hypertension   . Diabetes mellitus     History  Smoking status  . Never Smoker   Smokeless tobacco  . Not on file    Family History  Problem Relation Age of Onset  . Diabetes type II Mother   . Diabetes type II Father   . Stroke Daughter     Current Outpatient Prescriptions on File Prior to Visit  Medication Sig Dispense Refill  . alendronate (FOSAMAX) 70 MG tablet Take 70 mg by mouth once a week. Take one pill by mouth, once per week.  Nothing per mouth for > 30 minutes afterwards, sitting upright    . lisinopril (PRINIVIL,ZESTRIL) 20 MG tablet Take 1 tablet (20 mg total) by mouth daily. 90 tablet 3  . metFORMIN (GLUCOPHAGE) 500 MG tablet TAKE 1 TABLET BY MOUTH TWICE DAILY WITH MEALS 60 tablet 0  . pravastatin (PRAVACHOL) 20 MG tablet Take 1 tablet (20 mg total) by mouth daily. 30 tablet 11  . saxagliptin HCl (ONGLYZA) 2.5 MG TABS tablet Take 1 tablet (2.5 mg total) by mouth daily. 30 tablet 11  . triamcinolone cream (KENALOG) 0.1 % Apply 1 application topically 2 (two) times daily. Perform for 14 days. 85.2 g 2  . Vitamin D, Ergocalciferol, (DRISDOL) 50000 UNITS CAPS capsule Take 2,000 Units by mouth daily. 1 tab PO qd     No current facility-administered medications on file prior  to visit.    ROS: Per HPI.  All other systems reviewed and are negative.   Physical Exam Filed Vitals:   03/06/14 1453  BP: 140/88  Pulse:   Temp:    Physical Examination: General appearance - alert, well appearing, and in no distress Neurological - No focal deficits Extremities - peripheral pulses normal, no pedal edema, no clubbing or cyanosis.   Cardio: RRR, no murmurs noted   Lab Results  Component Value Date   HGBA1C 6.5 03/06/2014   BMET    Component Value Date/Time   NA 141 08/16/2012 1458   K 3.9 08/16/2012 1458   CL 104 08/16/2012 1458   CO2 28 08/16/2012 1458   GLUCOSE 125* 08/16/2012 1458   BUN 14 08/16/2012 1458   CREATININE 0.78 08/16/2012 1458   CREATININE 0.90 09/02/2010 1822   CALCIUM 10.5 08/16/2012 1458   GFRNONAA >60 09/02/2010 1822   GFRAA >60 09/02/2010 1822

## 2014-03-07 LAB — CBC WITH DIFFERENTIAL/PLATELET
BASOS ABS: 0 10*3/uL (ref 0.0–0.1)
Basophils Relative: 0 % (ref 0–1)
Eosinophils Absolute: 0 10*3/uL (ref 0.0–0.7)
Eosinophils Relative: 0 % (ref 0–5)
HEMATOCRIT: 39.2 % (ref 36.0–46.0)
Hemoglobin: 13 g/dL (ref 12.0–15.0)
Lymphocytes Relative: 29 % (ref 12–46)
Lymphs Abs: 3.4 10*3/uL (ref 0.7–4.0)
MCH: 29.8 pg (ref 26.0–34.0)
MCHC: 33.2 g/dL (ref 30.0–36.0)
MCV: 89.9 fL (ref 78.0–100.0)
MONO ABS: 0.6 10*3/uL (ref 0.1–1.0)
MONOS PCT: 5 % (ref 3–12)
MPV: 11.2 fL (ref 8.6–12.4)
NEUTROS ABS: 7.8 10*3/uL — AB (ref 1.7–7.7)
Neutrophils Relative %: 66 % (ref 43–77)
PLATELETS: 245 10*3/uL (ref 150–400)
RBC: 4.36 MIL/uL (ref 3.87–5.11)
RDW: 14.8 % (ref 11.5–15.5)
WBC: 11.8 10*3/uL — AB (ref 4.0–10.5)

## 2014-03-08 LAB — LIPID PANEL
Cholesterol: 190 mg/dL (ref 0–200)
HDL: 73 mg/dL (ref >39)
LDL CALC: 100 mg/dL — AB (ref 0–99)
Total CHOL/HDL Ratio: 2.6 Ratio
Triglycerides: 83 mg/dL (ref <150)
VLDL: 17 mg/dL (ref 0–40)

## 2014-03-21 ENCOUNTER — Other Ambulatory Visit: Payer: Self-pay | Admitting: Family Medicine

## 2014-06-21 ENCOUNTER — Other Ambulatory Visit: Payer: Self-pay | Admitting: Family Medicine

## 2014-09-11 ENCOUNTER — Other Ambulatory Visit: Payer: Self-pay | Admitting: Family Medicine

## 2014-09-12 NOTE — Telephone Encounter (Signed)
Did you want [facility] refill this [facility]

## 2014-11-26 ENCOUNTER — Other Ambulatory Visit: Payer: Self-pay | Admitting: Family Medicine

## 2014-11-26 NOTE — Telephone Encounter (Signed)
Did you want to refill this or send to [facility] 

## 2014-11-27 ENCOUNTER — Other Ambulatory Visit: Payer: Self-pay | Admitting: Family Medicine

## 2014-11-27 NOTE — Telephone Encounter (Signed)
Pt calling and needs a refill on her ONGLYZA 2.5 MG TABS tablet sent to [facility] on [address]. This has already been requested to Dr. Paulina Fusi (her old provider) and she didn't understand why it wasn't being refilled. I explained the situation to her and encouraged her to make an appt with her new MD. She is scheduled next Friday. Be that as it may, she still needs her medication. Sadie Reynolds, ASA

## 2014-11-29 ENCOUNTER — Other Ambulatory Visit: Payer: Self-pay | Admitting: Family Medicine

## 2014-11-29 MED ORDER — SAXAGLIPTIN HCL 2.5 MG PO TABS: 2.5000 mg | ORAL_TABLET | Freq: Every day | ORAL | Status: DC

## 2014-12-07 ENCOUNTER — Ambulatory Visit: Payer: No Typology Code available for payment source | Admitting: Family Medicine

## 2014-12-17 ENCOUNTER — Ambulatory Visit: Payer: No Typology Code available for payment source | Admitting: Family Medicine

## 2015-01-02 ENCOUNTER — Ambulatory Visit: Payer: No Typology Code available for payment source | Admitting: Family Medicine

## 2015-01-04 ENCOUNTER — Other Ambulatory Visit: Payer: Self-pay | Admitting: Family Medicine

## 2015-01-04 NOTE — Telephone Encounter (Signed)
This is your pt, see med refill 

## 2015-01-07 NOTE — Telephone Encounter (Signed)
Will give 1 month supply of medication. Patient was supposed to see me in clinic last week but did not show up to appointment. Will see patient on rescheduled appointment on 11/15.

## 2015-01-15 ENCOUNTER — Ambulatory Visit: Payer: No Typology Code available for payment source | Admitting: Family Medicine

## 2015-02-20 ENCOUNTER — Other Ambulatory Visit: Payer: Self-pay | Admitting: Family Medicine

## 2015-02-21 MED ORDER — METFORMIN HCL 500 MG PO TABS: 500.0000 mg | ORAL_TABLET | Freq: Two times a day (BID) | ORAL | Status: DC

## 2015-02-21 NOTE — Telephone Encounter (Signed)
Patient has not been seen since January 2016. She was supposed to come into the clinic in November but did not show up to her appointment. Will give patient 2 week supply of medication for now but she will need to schedule an appointment with our clinic to continue getting diabetic and antihypertensive medications.  Please call patient to schedule appointment.  Anders Simmondshristina Hildreth Orsak, MD [facility], PGY-1

## 2015-05-27 ENCOUNTER — Encounter: Payer: No Typology Code available for payment source | Admitting: Family Medicine

## 2015-07-02 ENCOUNTER — Ambulatory Visit: Payer: No Typology Code available for payment source | Admitting: Family Medicine

## 2015-08-26 ENCOUNTER — Encounter: Payer: Self-pay | Admitting: Family Medicine

## 2015-08-26 ENCOUNTER — Ambulatory Visit (INDEPENDENT_AMBULATORY_CARE_PROVIDER_SITE_OTHER): Payer: BLUE CROSS/BLUE SHIELD | Admitting: Family Medicine

## 2015-08-26 VITALS — BP 160/86 | HR 77 | Temp 97.6°F | Ht 62.0 in | Wt 99.2 lb

## 2015-08-26 DIAGNOSIS — F418 Other specified anxiety disorders: Secondary | ICD-10-CM

## 2015-08-26 DIAGNOSIS — F32A Depression, unspecified: Secondary | ICD-10-CM

## 2015-08-26 DIAGNOSIS — F329 Major depressive disorder, single episode, unspecified: Secondary | ICD-10-CM

## 2015-08-26 DIAGNOSIS — E119 Type 2 diabetes mellitus without complications: Secondary | ICD-10-CM | POA: Diagnosis not present

## 2015-08-26 DIAGNOSIS — F419 Anxiety disorder, unspecified: Secondary | ICD-10-CM

## 2015-08-26 LAB — LIPID PANEL
CHOL/HDL RATIO: 3 ratio (ref <=5.0)
Cholesterol: 231 mg/dL — ABNORMAL HIGH (ref 125–200)
HDL: 76 mg/dL (ref >=46)
LDL CALC: 141 mg/dL — AB (ref <130)
TRIGLYCERIDES: 71 mg/dL (ref <150)
VLDL: 14 mg/dL (ref <30)

## 2015-08-26 LAB — CBC
HEMATOCRIT: 42.6 % (ref 35.0–45.0)
HEMOGLOBIN: 14.2 g/dL (ref 11.7–15.5)
MCH: 29.6 pg (ref 27.0–33.0)
MCHC: 33.3 g/dL (ref 32.0–36.0)
MCV: 88.8 fL (ref 80.0–100.0)
MPV: 11.8 fL (ref 7.5–12.5)
PLATELETS: 204 10*3/uL (ref 140–400)
RBC: 4.8 MIL/uL (ref 3.80–5.10)
RDW: 14.4 % (ref 11.0–15.0)
WBC: 10.6 10*3/uL (ref 3.8–10.8)

## 2015-08-26 LAB — COMPLETE METABOLIC PANEL WITH GFR
ALBUMIN: 4.8 g/dL (ref 3.6–5.1)
ALT: 9 U/L (ref 6–29)
AST: 11 U/L (ref 10–35)
Alkaline Phosphatase: 63 U/L (ref 33–130)
BILIRUBIN TOTAL: 0.5 mg/dL (ref 0.2–1.2)
BUN: 18 mg/dL (ref 7–25)
CALCIUM: 9.8 mg/dL (ref 8.6–10.4)
CHLORIDE: 105 mmol/L (ref 98–110)
CO2: 28 mmol/L (ref 20–31)
Creat: 0.81 mg/dL (ref 0.50–1.05)
GFR, EST NON AFRICAN AMERICAN: 81 mL/min (ref >=60)
GLUCOSE: 129 mg/dL — AB (ref 65–99)
POTASSIUM: 4.4 mmol/L (ref 3.5–5.3)
Sodium: 141 mmol/L (ref 135–146)
TOTAL PROTEIN: 7.5 g/dL (ref 6.1–8.1)

## 2015-08-26 LAB — TSH: TSH: 1.85 mIU/L

## 2015-08-26 LAB — POCT GLYCOSYLATED HEMOGLOBIN (HGB A1C): Hemoglobin A1C: 7.1

## 2015-08-26 MED ORDER — PRAVASTATIN SODIUM 20 MG PO TABS: 20.0000 mg | ORAL_TABLET | Freq: Every day | ORAL | Status: DC

## 2015-08-26 MED ORDER — LISINOPRIL 20 MG PO TABS: 20.0000 mg | ORAL_TABLET | Freq: Every day | ORAL | Status: DC

## 2015-08-26 MED ORDER — FLUOXETINE HCL 20 MG PO CAPS: 20.0000 mg | ORAL_CAPSULE | Freq: Every day | ORAL | Status: DC

## 2015-08-26 MED ORDER — METFORMIN HCL 500 MG PO TABS: 500.0000 mg | ORAL_TABLET | Freq: Two times a day (BID) | ORAL | Status: DC

## 2015-08-26 NOTE — Progress Notes (Signed)
Subjective:    Patient ID: [name] , female   DOB: 1960/04/25 , 55 y.o..   MRN: [id number]  HPI  [name] is here for follow up but depression and anxiety are her main concerns today.  Anxiety and Depression:  - Patient presents with worsening depression and anxiety over the last 6 months. She has never been officially diagnosed. -She is tearful during the interview while discussing her symptoms. She states the anxiety is actually worse than the depression.  - Patient states that she has felt depressed and anxious since her daughter's sudden death in 2007. She feels as though she never really dealt with her emotions from that event.  - Recently over the last couple months she has been stressed out with gaining the custody of her grandchildren. This has amplified her emotions.  - Admits to decreased appetite and skipping meals due to being so upset which has led to weight loss.  - Patient states she has talked to a healthcare professional before and was prescribed Xanax but denies taking it regularly. She is wondering if she needs to be on Xanax.  - She is interested in speaking with behavioral health specialist about her mood because she feels as though she needs to "let it out".  - Is interested in started a medication to help her cope with her mood as she states she "just can't take it anymore" - Of note, she has not taken any medications in the last 6 months because she has not had any insurance to pay for the medications but she just got some health insurance. - Denies any suicidal or homicidal ideations  - Denies any manic symptoms in the past or present such as staying up for days at a time, risky behavior, or having lots of energy -No hx of previous psychiatric history  PHQ-9: [facility] 08/26/2015  Decreased Interest 1  Down, Depressed, Hopeless 1  PHQ - 2 Score 2  Altered sleeping 0  Tired, decreased energy 1  Change in appetite 1  Feeling bad or failure  about yourself  1  Trouble concentrating 0  Moving slowly or fidgety/restless 0  Suicidal thoughts 0  PHQ-9 Score 5  Difficult doing work/chores Somewhat difficult    Review of Systems: Per HPI. All other systems reviewed and are negative.  Past Medical History reviewed in Epic  Medications: reviewed and updated  Social Hx:  reports that she has never smoked. She does not have any smokeless tobacco history on file.    Objective:   BP 160/86 mmHg  Pulse 77  Temp(Src) 97.6 F (36.4 C) (Oral)  Ht 5\' 2"  (1.575 m)  Wt 99 lb 3.2 oz (44.997 kg)  BMI 18.14 kg/m2 Physical Exam  Gen: Pleasant but tearful, alert, cooperative with exam Cardiac: Regular rate and rhythm, normal S1/S2, no murmur, no edema, capillary refill brisk  Respiratory: Clear to auscultation bilaterally, no wheezes, non-labored Psych: Tearful mood, normal affect, pressured speech, no suicidal/homicidal ideations   Assessment & Plan:  Anxiety and depression New problem per Epic review, however, has been ongoing since 2007 per patient and worsening over last 6 months. PHQ-9 of 5 indicating mild depression. Anxiety appears to be more prevalent than depressive symptoms. Patient very eager to speak to someone about her mood and agreeable to start an SSRI. No SI/HI at this time or manic symptoms.  - Start Prozac 20 mg daily - Patient will find therapist in [place] area, no referral needed  for her insurance - TSH ordered to assess for thyroid abnormalities that could lead to a mood disturbance - Refilled chronic medications for now and will need to readdress chronic problems in the near future as well as health maintenance (discussed in AVS). CBC, CMP, Lipid panel ordered - Follow up in 2 weeks

## 2015-08-26 NOTE — Assessment & Plan Note (Addendum)
New problem per Epic review, however, has been ongoing since 2007 per patient and worsening over last 6 months. PHQ-9 of 5 indicating mild depression. Anxiety appears to be more prevalent than depressive symptoms. Patient very eager to speak to someone about her mood and agreeable to start an SSRI. No SI/HI at this time or manic symptoms.  - Start Prozac 20 mg daily - [place], no referral needed for her insurance - TSH ordered to assess for thyroid abnormalities that could lead to a mood disturbance - Refilled chronic medications for now and will need to readdress chronic problems in the near future as well as health maintenance (discussed in AVS). CBC, CMP, Lipid panel ordered - Follow up in 2 weeks

## 2015-08-26 NOTE — Patient Instructions (Addendum)
Thank you for coming in today, it was so nice to see you. Today we talked about:   1. Mood: We will start Prozac 20 mg daily. I have sent this prescription to your pharmacy. Please find a therapist in the [place] area. I need to see you again in 2 weeks.  2. Diabetes: We will check your hemoglobin A1C today. For now, continue taking Metformin 500 mg twice daily. If your A1C is too high, we may start another medication 3. High blood pressure: Please continue to take Lisinopril as prescribed 4. Cholesterol: We will check your cholesterol today. Please continue taking your Pravastatin as prescribed 5. Health maintenance: I would like for you to get a mammogram, colonoscopy, and pap smear. We can discuss these in more detail at another visit  Please follow up in 2 weeks. You can schedule this appointment at the front desk before you leave or call the clinic.  If you have any questions or concerns, please do not hesitate to call the office at [phone]. You can also message me directly via MyChart.   Sincerely,  Anders Simmondshristina Dak Szumski, MD  Fluoxetine capsules or tablets (Depression/Mood Disorders) What is this medicine? FLUOXETINE (floo OX e teen) belongs to a class of drugs known as selective serotonin reuptake inhibitors (SSRIs). It helps to treat mood problems such as depression, obsessive compulsive disorder, and panic attacks. It can also treat certain eating disorders. This medicine may be used for other purposes; ask your health care provider or pharmacist if you have questions. What should I tell my health care provider before I take this medicine? They need to know if you have any of these conditions: -bipolar disorder or mania -diabetes -glaucoma -liver disease -psychosis -seizures -suicidal thoughts or history of attempted suicide -an unusual or allergic reaction to fluoxetine, other medicines, foods, dyes, or preservatives -pregnant or trying to get  pregnant -breast-feeding How should I use this medicine? Take this medicine by mouth with a glass of water. Follow the directions on the prescription label. You can take this medicine with or without food. Take your medicine at regular intervals. Do not take it more often than directed. Do not stop taking this medicine suddenly except upon the advice of your doctor. Stopping this medicine too quickly may cause serious side effects or your condition may worsen. A special MedGuide will be given to you by the pharmacist with each prescription and refill. Be sure to read this information carefully each time. Talk to your pediatrician regarding the use of this medicine in children. While this drug may be prescribed for children as young as 7 years for selected conditions, precautions do apply. Overdosage: If you think you have taken too much of this medicine contact a poison control center or emergency room at once. NOTE: This medicine is only for you. Do not share this medicine with others. What if I miss a dose? If you miss a dose, skip the missed dose and go back to your regular dosing schedule. Do not take double or extra doses. What may interact with this medicine? Do not take fluoxetine with any of the following medications: -other medicines containing fluoxetine, like Sarafem or Symbyax -cisapride -linezolid -MAOIs like Carbex, Eldepryl, Marplan, Nardil, and Parnate -methylene blue (injected into a vein) -pimozide -thioridazine This medicine may also interact with the following medications: -alcohol -aspirin and aspirin-like medicines -carbamazepine -certain medicines for depression, anxiety, or psychotic disturbances -certain medicines for migraine headaches like almotriptan, eletriptan, frovatriptan, naratriptan, rizatriptan, sumatriptan, zolmitriptan -digoxin -  diuretics -fentanyl -flecainide -furazolidone -isoniazid -lithium -medicines for sleep -medicines that treat or prevent  blood clots like warfarin, enoxaparin, and dalteparin -NSAIDs, medicines for pain and inflammation, like ibuprofen or naproxen -phenytoin -procarbazine -propafenone -rasagiline -ritonavir -supplements like St. John's wort, kava kava, valerian -tramadol -tryptophan -vinblastine This list may not describe all possible interactions. Give your health care provider a list of all the medicines, herbs, non-prescription drugs, or dietary supplements you use. Also tell them if you smoke, drink alcohol, or use illegal drugs. Some items may interact with your medicine. What should I watch for while using this medicine? Tell your doctor if your symptoms do not get better or if they get worse. Visit your doctor or health care professional for regular checks on your progress. Because it may take several weeks to see the full effects of this medicine, it is important to continue your treatment as prescribed by your doctor. Patients and their families should watch out for new or worsening thoughts of suicide or depression. Also watch out for sudden changes in feelings such as feeling anxious, agitated, panicky, irritable, hostile, aggressive, impulsive, severely restless, overly excited and hyperactive, or not being able to sleep. If this happens, especially at the beginning of treatment or after a change in dose, call your health care professional. Bonita QuinYou may get drowsy or dizzy. Do not drive, use machinery, or do anything that needs mental alertness until you know how this medicine affects you. Do not stand or sit up quickly, especially if you are an older patient. This reduces the risk of dizzy or fainting spells. Alcohol may interfere with the effect of this medicine. Avoid alcoholic drinks. Your mouth may get dry. Chewing sugarless gum or sucking hard candy, and drinking plenty of water may help. Contact your doctor if the problem does not go away or is severe. This medicine may affect blood sugar levels. If you  have diabetes, check with your doctor or health care professional before you change your diet or the dose of your diabetic medicine. What side effects may I notice from receiving this medicine? Side effects that you should report to your doctor or health care professional as soon as possible: -allergic reactions like skin rash, itching or hives, swelling of the face, lips, or tongue -breathing problems -confusion -eye pain, changes in vision -fast or irregular heart rate, palpitations -flu-like fever, chills, cough, muscle or joint aches and pains -seizures -suicidal thoughts or other mood changes -swelling or redness in or around the eye -tremors -trouble sleeping -unusual bleeding or bruising -unusually tired or weak -vomiting Side effects that usually do not require medical attention (report to your doctor or health care professional if they continue or are bothersome): -change in sex drive or performance -diarrhea -dry mouth -flushing -headache -increased or decreased appetite -nausea -sweating This list may not describe all possible side effects. Call your doctor for medical advice about side effects. You may report side effects to FDA at 1-800-FDA-1088. Where should I keep my medicine? Keep out of the reach of children. Store at room temperature between 15 and 30 degrees C (59 and 86 degrees F). Throw away any unused medicine after the expiration date. NOTE: This sheet is a summary. It may not cover all possible information. If you have questions about this medicine, talk to your doctor, pharmacist, or health care provider.    2016, Elsevier/Gold Standard. (2014-02-09 12:40:07)

## 2015-08-27 ENCOUNTER — Encounter: Payer: Self-pay | Admitting: Family Medicine

## 2015-08-27 ENCOUNTER — Telehealth: Payer: Self-pay | Admitting: Family Medicine

## 2015-08-27 NOTE — Telephone Encounter (Signed)
Called patient to discuss lab results. No answer. Left voicemail to call clinic back. Patient has high cholesterol and A1C. Will send letter with results for now and see patient in clinic in 2 weeks.

## 2015-09-16 ENCOUNTER — Ambulatory Visit: Payer: BLUE CROSS/BLUE SHIELD | Admitting: Family Medicine

## 2015-10-01 ENCOUNTER — Ambulatory Visit: Payer: BLUE CROSS/BLUE SHIELD | Admitting: Family Medicine

## 2015-10-18 ENCOUNTER — Ambulatory Visit: Payer: BLUE CROSS/BLUE SHIELD | Admitting: Family Medicine

## 2015-12-05 ENCOUNTER — Ambulatory Visit: Payer: BLUE CROSS/BLUE SHIELD | Admitting: Family Medicine

## 2016-01-30 ENCOUNTER — Telehealth: Payer: Self-pay | Admitting: Family Medicine

## 2016-01-30 ENCOUNTER — Encounter: Payer: Self-pay | Admitting: Family Medicine

## 2016-01-30 DIAGNOSIS — Z91199 Patient's noncompliance with other medical treatment and regimen due to unspecified reason: Secondary | ICD-10-CM | POA: Insufficient documentation

## 2016-01-30 DIAGNOSIS — Z5329 Procedure and treatment not carried out because of patient's decision for other reasons: Secondary | ICD-10-CM | POA: Insufficient documentation

## 2016-01-30 NOTE — Telephone Encounter (Signed)
Spoke with her Has not had insurance but expects it soon  Related that missing appts keeps us from being able to see other patients who need us and that if she continues to miss appts we will not be able to keep seeing her  She apologizes and plans to make and keep an appointment as soon as she has insurance

## 2016-02-17 ENCOUNTER — Encounter (HOSPITAL_COMMUNITY): Payer: Self-pay | Admitting: Emergency Medicine

## 2016-02-17 ENCOUNTER — Ambulatory Visit (HOSPITAL_COMMUNITY)
Admission: EM | Admit: 2016-02-17 | Discharge: 2016-02-17 | Disposition: A | Discharge status: Home / Self Care | Payer: Self-pay | Attending: Family Medicine | Admitting: Family Medicine

## 2016-02-17 DIAGNOSIS — L02415 Cutaneous abscess of right lower limb: Secondary | ICD-10-CM | POA: Insufficient documentation

## 2016-02-17 DIAGNOSIS — Z79899 Other long term (current) drug therapy: Secondary | ICD-10-CM | POA: Insufficient documentation

## 2016-02-17 DIAGNOSIS — L0291 Cutaneous abscess, unspecified: Secondary | ICD-10-CM

## 2016-02-17 DIAGNOSIS — Z7984 Long term (current) use of oral hypoglycemic drugs: Secondary | ICD-10-CM | POA: Insufficient documentation

## 2016-02-17 MED ORDER — CEPHALEXIN 500 MG PO CAPS: 500.0000 mg | ORAL_CAPSULE | Freq: Four times a day (QID) | ORAL | 0 refills | Status: DC

## 2016-02-17 NOTE — Discharge Instructions (Signed)
Wash 2-3 times a day, take all of medicine. Return if needed.

## 2016-02-17 NOTE — ED Triage Notes (Signed)
[facility] with a complaint of a possible abscess on her right lower leg x 3 days.

## 2016-02-17 NOTE — ED Provider Notes (Signed)
[facility]    CSN: [id number] Arrival date & time: 02/17/16  1541     History   Chief Complaint Chief Complaint  Patient presents with  . Abscess    HPI [name] is a 55 y.o. female.   The history is provided by the patient.  Abscess  Location:  Leg Leg abscess location:  R lower leg Abscess quality: draining   Red streaking: no   Duration:  3 days Progression:  Improving Context: skin injury   Relieved by:  Warm compresses Associated symptoms: no fever   Risk factors: no hx of MRSA and no prior abscess     Past Medical History:  Diagnosis Date  . Diabetes mellitus   . Hepatitis C   . Hypertension     Patient Active Problem List   Diagnosis Date Noted  . Frequent No-show for appointment 01/30/2016  . Anxiety and depression 08/26/2015  . Preventative health care 03/06/2014  . Keloid 07/20/2013  . Osteoporosis, unspecified 01/03/2013  . Wrist pain, chronic 08/16/2012  . Deformity, wrist acquired 08/16/2012  . History of hepatitis C 10/29/2011  . Diabetes type 2, controlled (HCC) 10/29/2011  . Hypertension 10/29/2011  . Hyperlipidemia 10/29/2011    History reviewed. No pertinent surgical history.  OB History    No data available       Home Medications    Prior to Admission medications   Medication Sig Start Date End Date Taking? Authorizing Provider  lisinopril (PRINIVIL,ZESTRIL) 20 MG tablet Take 1 tablet (20 mg total) by mouth daily. 08/26/15  Yes Beaulah Dinninghristina M Gambino, MD  metFORMIN (GLUCOPHAGE) 500 MG tablet Take 1 tablet (500 mg total) by mouth 2 (two) times daily with a meal. 08/26/15  Yes Beaulah Dinninghristina M Gambino, MD  pravastatin (PRAVACHOL) 20 MG tablet Take 1 tablet (20 mg total) by mouth daily. 08/26/15  Yes Beaulah Dinninghristina M Gambino, MD  alendronate (FOSAMAX) 70 MG tablet Take 70 mg by mouth once a week. Reported on 08/26/2015 12/20/12   Historical Provider, MD  FLUoxetine (PROZAC) 20 MG capsule Take 1 capsule (20 mg total) by mouth  daily. Patient not taking: Reported on 02/17/2016 08/26/15   Beaulah Dinninghristina M Gambino, MD  triamcinolone cream (KENALOG) 0.1 % Apply 1 application topically 2 (two) times daily. Perform for 14 days. Patient not taking: Reported on 02/17/2016 07/20/13   Briscoe DeutscherBryan R Hess, DO  Vitamin D, Ergocalciferol, (DRISDOL) 50000 UNITS CAPS capsule Take 2,000 Units by mouth daily. Reported on 08/26/2015 11/22/12   Historical Provider, MD    Family History Family History  Problem Relation Age of Onset  . Diabetes type II Mother   . Diabetes type II Father   . Stroke Daughter     Social History Social History  Substance Use Topics  . Smoking status: Never Smoker  . Smokeless tobacco: Not on file  . Alcohol use No     Allergies   Patient has no known allergies.   Review of Systems Review of Systems  Constitutional: Negative for fever.  Skin: Positive for wound.  All other systems reviewed and are negative.    Physical Exam Triage Vital Signs ED Triage Vitals  Enc Vitals Group     BP 02/17/16 1608 143/87     Pulse Rate 02/17/16 1608 120     Resp 02/17/16 1608 18     Temp 02/17/16 1608 98.3 F (36.8 C)     Temp Source 02/17/16 1608 Oral     SpO2 02/17/16 1608 99 %  Weight --      Height --      Head Circumference --      Peak Flow --      Pain Score 02/17/16 1607 4     Pain Loc --      Pain Edu? --      Excl. in GC? --    No data found.   Updated Vital Signs BP 143/87 (BP Location: Left Arm)   Pulse 120   Temp 98.3 F (36.8 C) (Oral)   Resp 18   SpO2 99%   Visual Acuity Right Eye Distance:   Left Eye Distance:   Bilateral Distance:    Right Eye Near:   Left Eye Near:    Bilateral Near:     Physical Exam  Constitutional: She appears well-developed and well-nourished.  Skin: Skin is warm and dry. There is erythema.  Draining lesion right lower  Leg. Wound care done. Cult obtained.     UC Treatments / Results  Labs (all labs ordered are listed, but only abnormal  results are displayed) Labs Reviewed - No data to display  EKG  EKG Interpretation None       Radiology No results found.  Procedures Procedures (including critical care time)  Medications Ordered in UC Medications - No data to display   Initial Impression / Assessment and Plan / UC Course  I have reviewed the triage vital signs and the nursing notes.  Pertinent labs & imaging results that were available during my care of the patient were reviewed by me and considered in my medical decision making (see chart for details).  Clinical Course       Final Clinical Impressions(s) / UC Diagnoses   Final diagnoses:  None    New Prescriptions New Prescriptions   No medications on file     Linna HoffJames D Kindl, MD 02/18/16 2059

## 2016-02-20 LAB — AEROBIC CULTURE W GRAM STAIN (SUPERFICIAL SPECIMEN)

## 2016-02-20 LAB — AEROBIC CULTURE  (SUPERFICIAL SPECIMEN)

## 2016-08-05 ENCOUNTER — Ambulatory Visit: Payer: BLUE CROSS/BLUE SHIELD | Admitting: Family Medicine

## 2016-08-07 ENCOUNTER — Other Ambulatory Visit: Payer: Self-pay | Admitting: Family Medicine

## 2016-08-10 NOTE — Telephone Encounter (Signed)
Refill request for Metformin. Patient has not been seen in the clinic for 1 year. [facility] team please call patient and inform her I will give a 30 day refill but she will need to be seen for her diabetes ASAP to receive refills in the future. Thank you.   Anders Simmondshristina Zacary Bauer, MD [facility], PGY-2

## 2016-08-10 NOTE — Telephone Encounter (Signed)
Spoke to pt. Pt has an appt on 08/14/2016. Sunday SpillersSharon T Demetrus Pavao, CMA

## 2016-08-13 ENCOUNTER — Telehealth: Payer: Self-pay | Admitting: Family Medicine

## 2016-08-13 NOTE — Telephone Encounter (Signed)
Pre-visit call. Appointment confirmed for 08/13/16. Patient was advised to arrive early for check-in and to bring all current medication. Patient has a number of outstanding health maintenance items.

## 2016-08-14 ENCOUNTER — Encounter: Payer: BLUE CROSS/BLUE SHIELD | Admitting: Family Medicine

## 2016-08-14 NOTE — Progress Notes (Deleted)
Subjective:  [name] is a 56 y.o. year old female who presents to office today for an annual physical examination.  Concerns today include:  1.   Chronic Diabetes  Disease Monitoring  Blood Sugar Ranges: ***  Polyuria: {YES/NO/WILD UJWJX:91478}   Visual problems: {YES/NO/WILD CARDS:18581}   Last hemoglobin A1C:  Lab Results  Component Value Date   HGBA1C 7.1 08/26/2015    Medication Compliance: {YES/NO/WILD CARDS:18581}  Medication Side Effects  Hypoglycemia: {YES/NO/WILD GNFAO:13086}   Preventitive Health Care  Eye Exam: ***  Foot Exam: ***  Diet pattern: ***  Exercise: ***    Review of Systems {ros; complete:30496}   Review of Systems: Per HPI. All other systems reviewed and are negative.  General Healthcare: Medication Compliance: *** Dx Hypertension: *** Dx Hyperlipidemia: *** Diabetes: *** Dx Obesity: *** Weight Loss: *** Physical Activity: *** Urinary Incontinence: ***  Menstrual hx: ***  Social:  reports that she has never smoked. She does not have any smokeless tobacco history on file. Driving: *** Alcohol Use: *** Tobacco ***  Other Drugs: ***  Support and Life at Home: *** Advanced Directives: *** Work: ***  Cancer:  Colorectal >> Colonoscopy: *** Lung >> Tobacco Use: ***              - If so, previous Low-Dose CT screen: *** Breast >> Mammogram: *** Cervical/Endometrial >>  - Postmenopausal: *** - Hysterectomy: *** - Vaginal Bleeding: *** Skin >> Suspicious lesions: ***  Other: Osteoporosis: *** TDAP: *** every 69yrs - (<3 lifetime doses or unknown): all wounds -- look up need for Tetanus IG - (>=3 lifetime doses): clean/minor wound if >39yrs from previous; all other wounds if >51yrs from previous Zoster Vaccine: *** (those >50yo, once) Pneumonia Vaccine: *** (those w/ risk factors) - (<49yr) Both: Immunocompromised, cochlear implant, CSF leak, asplenic, sickle cell, Chronic Renal Failure - (<37yr) PPSV-23 only: Heart dz,  lung disease, DM, tobacco abuse, alcoholism, cirrhosis/liver disease. - (>42yr): PPSV13 then PPSV23 in 6-12mths;  - (>76yr): repeat PPSV23 once if pt received prior to 57yo and 66yrs have passed   Health Maintenance Due  Topic Date Due  . PAP SMEAR  04/14/1980  . MAMMOGRAM  04/14/2009  . COLONOSCOPY  04/14/2009  . OPHTHALMOLOGY EXAM  10/19/2014  . FOOT EXAM  03/07/2015  . TETANUS/TDAP  04/02/2015  . HEMOGLOBIN A1C  02/25/2016    Past Medical History Past Medical History:  Diagnosis Date  . Diabetes mellitus   . Hepatitis C   . Hypertension    Patient Active Problem List   Diagnosis Date Noted  . Frequent No-show for appointment 01/30/2016  . Anxiety and depression 08/26/2015  . Preventative health care 03/06/2014  . Keloid 07/20/2013  . Osteoporosis, unspecified 01/03/2013  . Wrist pain, chronic 08/16/2012  . Deformity, wrist acquired 08/16/2012  . History of hepatitis C 10/29/2011  . Diabetes type 2, controlled (HCC) 10/29/2011  . Hypertension 10/29/2011  . Hyperlipidemia 10/29/2011    Medications- reviewed and updated Current Outpatient Prescriptions  Medication Sig Dispense Refill  . alendronate (FOSAMAX) 70 MG tablet Take 70 mg by mouth once a week. Reported on 08/26/2015    . cephALEXin (KEFLEX) 500 MG capsule Take 1 capsule (500 mg total) by mouth 4 (four) times daily. Take all of medicine and drink lots of fluids 28 capsule 0  . FLUoxetine (PROZAC) 20 MG capsule Take 1 capsule (20 mg total) by mouth daily. (Patient not taking: Reported on 02/17/2016) 30 capsule 3  . lisinopril (PRINIVIL,ZESTRIL)  20 MG tablet Take 1 tablet (20 mg total) by mouth daily. 30 tablet 3  . metFORMIN (GLUCOPHAGE) 500 MG tablet TAKE 1 TABLET BY MOUTH TWICE DAILY WITH A MEAL 30 tablet 0  . pravastatin (PRAVACHOL) 20 MG tablet Take 1 tablet (20 mg total) by mouth daily. 30 tablet 5  . triamcinolone cream (KENALOG) 0.1 % Apply 1 application topically 2 (two) times daily. Perform for 14 days.  (Patient not taking: Reported on 02/17/2016) 85.2 g 2  . Vitamin D, Ergocalciferol, (DRISDOL) 50000 UNITS CAPS capsule Take 2,000 Units by mouth daily. Reported on 08/26/2015     No current facility-administered medications for this visit.     Objective: There were no vitals taken for this visit. Gen: In no acute distress, alert, cooperative with exam, well groomed HEENT: NCAT, EOMI, PERRL CV: Regular rate and rhythm, normal S1/S2, no murmur Resp: Clear to auscultation bilaterally, no wheezes, non-labored Abd: Soft, Non Tender, Non Distended, bowel sounds present, no guarding or organomegaly Ext: No edema, warm and well perfused Neuro: Alert and oriented, No gross deficits, normal gait Psych: Normal mood and affect   Assessment/Plan:  No problem-specific Assessment & Plan notes found for this encounter.   No orders of the defined types were placed in this encounter.   No orders of the defined types were placed in this encounter.    Anders Simmondshristina Soyla Bainter, MD [facility], PGY-2

## 2016-08-28 ENCOUNTER — Other Ambulatory Visit: Payer: Self-pay | Admitting: Family Medicine

## 2016-08-30 NOTE — Telephone Encounter (Signed)
Refill requested for Lisinopril. Patient has not been seen in more than 1 year. Will provide 30 day refill but she will need to see a provider in clinic in the next 1-4 weeks in order to continue receiving refills in the future.   [facility] team please call patient and have her schedule an appointment. Thank you.

## 2016-09-07 ENCOUNTER — Encounter: Payer: Self-pay | Admitting: *Deleted

## 2016-09-07 NOTE — Telephone Encounter (Signed)
attempted to call, LMOVM for pt to return call. Also mailed letter. Fleeger, Maryjo RochesterJessica Dawn, CMA

## 2017-03-23 ENCOUNTER — Encounter: Payer: BLUE CROSS/BLUE SHIELD | Admitting: Family Medicine

## 2017-03-23 NOTE — Progress Notes (Deleted)
Subjective:  [name] is a 57 y.o. year old female who presents to office today for an annual physical examination.  Concerns today include:  1. ***   Review of Systems {ros; complete:30496}   Review of Systems: Per HPI. All other systems reviewed and are negative.  General Healthcare: Medication Compliance: *** Dx Hypertension: *** Dx Hyperlipidemia: *** Diabetes: *** Dx Obesity: *** Weight Loss: *** Physical Activity: *** Urinary Incontinence: ***  Menstrual hx: *** Last dental exam: ***  Social:  reports that  has never smoked. She does not have any smokeless tobacco history on file. Driving: *** Alcohol Use: *** Tobacco ***  Other Drugs: ***  Support and Life at Home: *** Advanced Directives: *** Work: ***  Cancer:  Colorectal >> Colonoscopy: *** Lung >> Tobacco Use: ***              - If so, previous Low-Dose CT screen: *** Breast >> Mammogram: *** Cervical/Endometrial >>  - Postmenopausal: *** - Hysterectomy: *** - Vaginal Bleeding: *** Skin >> Suspicious lesions: ***  Other: Osteoporosis: *** TDAP: *** every 79yrs - (<3 lifetime doses or unknown): all wounds -- look up need for Tetanus IG - (>=3 lifetime doses): clean/minor wound if >11yrs from previous; all other wounds if >55yrs from previous Zoster Vaccine: *** (those >50yo, once) Pneumonia Vaccine: *** (those w/ risk factors) - (<79yr) Both: Immunocompromised, cochlear implant, CSF leak, asplenic, sickle cell, Chronic Renal Failure - (<9yr) PPSV-23 only: Heart dz, lung disease, DM, tobacco abuse, alcoholism, cirrhosis/liver disease. - (>64yr): PPSV13 then PPSV23 in 6-12mths;  - (>38yr): repeat PPSV23 once if pt received prior to 58yo and 73yrs have passed   Health Maintenance Due  Topic Date Due  . PAP SMEAR  04/14/1980  . MAMMOGRAM  04/14/2009  . COLONOSCOPY  04/14/2009  . OPHTHALMOLOGY EXAM  10/19/2014  . FOOT EXAM  03/07/2015  . TETANUS/TDAP  04/02/2015  . HEMOGLOBIN A1C   02/25/2016  . INFLUENZA VACCINE  09/30/2016    Past Medical History Past Medical History:  Diagnosis Date  . Diabetes mellitus   . Hepatitis C   . Hypertension    Patient Active Problem List   Diagnosis Date Noted  . Frequent No-show for appointment 01/30/2016  . Anxiety and depression 08/26/2015  . Preventative health care 03/06/2014  . Keloid 07/20/2013  . Osteoporosis, unspecified 01/03/2013  . Wrist pain, chronic 08/16/2012  . Deformity, wrist acquired 08/16/2012  . History of hepatitis C 10/29/2011  . Diabetes type 2, controlled (HCC) 10/29/2011  . Hypertension 10/29/2011  . Hyperlipidemia 10/29/2011    Medications- reviewed and updated Current Outpatient Medications  Medication Sig Dispense Refill  . alendronate (FOSAMAX) 70 MG tablet Take 70 mg by mouth once a week. Reported on 08/26/2015    . cephALEXin (KEFLEX) 500 MG capsule Take 1 capsule (500 mg total) by mouth 4 (four) times daily. Take all of medicine and drink lots of fluids 28 capsule 0  . FLUoxetine (PROZAC) 20 MG capsule Take 1 capsule (20 mg total) by mouth daily. (Patient not taking: Reported on 02/17/2016) 30 capsule 3  . lisinopril (PRINIVIL,ZESTRIL) 20 MG tablet TAKE 1 TABLET BY MOUTH DAILY 30 tablet 0  . metFORMIN (GLUCOPHAGE) 500 MG tablet TAKE 1 TABLET BY MOUTH TWICE DAILY WITH A MEAL 30 tablet 0  . pravastatin (PRAVACHOL) 20 MG tablet Take 1 tablet (20 mg total) by mouth daily. 30 tablet 5  . triamcinolone cream (KENALOG) 0.1 % Apply 1 application topically 2 (two)  times daily. Perform for 14 days. (Patient not taking: Reported on 02/17/2016) 85.2 g 2  . Vitamin D, Ergocalciferol, (DRISDOL) 50000 UNITS CAPS capsule Take 2,000 Units by mouth daily. Reported on 08/26/2015     No current facility-administered medications for this visit.     Objective: There were no vitals taken for this visit. Gen: In no acute distress, alert, cooperative with exam, well groomed HEENT: NCAT, EOMI, PERRL CV: Regular  rate and rhythm, normal S1/S2, no murmur Resp: Clear to auscultation bilaterally, no wheezes, non-labored Abd: Soft, Non Tender, Non Distended, bowel sounds present, no guarding or organomegaly Ext: No edema, warm and well perfused Neuro: Alert and oriented, No gross deficits, normal gait Psych: Normal mood and affect   Assessment/Plan:  No problem-specific Assessment & Plan notes found for this encounter.   No orders of the defined types were placed in this encounter.   No orders of the defined types were placed in this encounter.    Anders Simmondshristina Gambino, MD [facility], PGY-3

## 2017-06-10 NOTE — Progress Notes (Signed)
Subjective:    Patient ID: [name] , female   DOB: 1961/01/03 , 57 y.o..   MRN: [id number]  HPI  [name] is here for   1. Chronic Diabetes: Patient arrives tearful today wanting to discuss her diabetes.  She notes that she has been neglecting her health for several months now as she did not have insurance.  She has not been taking her medications.  She was borrowing some of her daughter's metformin and taking that for the last month.  Otherwise no medicines were taken.  She has not been checking her glucose at all.  She notes that she does not feel any nausea, vomiting, lightheadedness, dizziness, abdominal pain, fatigue.  She does admit to polyuria and polydipsia.  Notes that she urinates so frequently that sometimes she does not make it to the bathroom in time and will let herself.  She also notes that she has had some vaginal itching for the last week or so.  Review of Systems: Per HPI.   Past Medical History: Patient Active Problem List   Diagnosis Date Noted  . Frequent No-show for appointment 01/30/2016  . Anxiety and depression 08/26/2015  . Preventative health care 03/06/2014  . Keloid 07/20/2013  . Osteoporosis, unspecified 01/03/2013  . Wrist pain, chronic 08/16/2012  . Deformity, wrist acquired 08/16/2012  . History of hepatitis C 10/29/2011  . Diabetes type 2, controlled ([place]) 10/29/2011  . Hypertension 10/29/2011  . Hyperlipidemia 10/29/2011     Social Hx:  reports that she has never smoked. She has never used smokeless tobacco.   Objective:   BP 122/84   Pulse 81   Temp 98.2 F (36.8 C) (Oral)   Ht 5' 2"  (1.575 m)   Wt 94 lb 3.2 oz (42.7 kg)   SpO2 97%   BMI 17.23 kg/m  Physical Exam  Gen: NAD, alert, cooperative with exam, thin appearing HEENT: NCAT, PERRL, clear conjunctiva, oropharynx clear, supple neck, moist mucous membranes Cardiac: Regular rate and rhythm, normal S1/S2, no murmur, no edema, capillary refill brisk  Respiratory: Clear to  auscultation bilaterally, no wheezes, non-labored breathing Gastrointestinal: soft, non tender, non distended, bowel sounds present  Assessment & Plan:  Diabetes type 2, controlled (HCC) Very poorly controlled.  Patient contributes no insurance as to her reason why not following up or taking her medications.  A1c today >15.  Does not have any classic DKA symptoms however given her very high A1c will get a BMP today.  She is also thin which is unusual for type 2 diabetes, will obtain C-peptide.  Discussed with patient the severity of her diabetes and need for follow-up.  She agrees to follow-up more frequently now that she has insurance again. -Begin Lantus 10 units nightly, titrate up 1 unit every night as long as fasting glucoses above 120 (she has taken insulin before and is familiar with administering it to herself) -Metformin 1000 mg twice daily -Sent supplies to pharmacy; glucometer, glucose test strips, lancets -Follow-up in 5 days to review fasting glucoses and make medication adjustments if needed  Vaginal itching: Highly suspicious that this is a yeast infection given  that her glucose is likely high in her urine.  We did not have time to do a vaginal exam today.  Sent prescription for Diflucan for presumed candidal vaginitis to her pharmacy and will follow up next week   Orders Placed This Encounter  Procedures  . Basic Metabolic Panel  . C-peptide  . HgB A1c  Meds ordered this encounter  Medications  . Insulin Glargine (LANTUS SOLOSTAR) 100 UNIT/ML Solostar Pen    Sig: Inject 10 Units into the skin daily at 10 pm.    Dispense:  5 pen    Refill:  PRN  . lisinopril (PRINIVIL,ZESTRIL) 20 MG tablet    Sig: Take 1 tablet (20 mg total) by mouth daily.    Dispense:  30 tablet    Refill:  0  . metFORMIN (GLUCOPHAGE) 500 MG tablet    Sig: TAKE 1 TABLET BY MOUTH TWICE DAILY WITH A MEAL    Dispense:  30 tablet    Refill:  0  . Blood Glucose Monitoring Suppl (BAYER CONTOUR LINK  MONITOR) w/Device KIT    Sig: 1 application by Does not apply route 2 (two) times daily.    Dispense:  1 kit    Refill:  1  . glucose blood (BAYER CONTOUR TEST) test strip    Sig: Use as instructed    Dispense:  100 each    Refill:  12  . BAYER MICROLET LANCETS lancets    Sig: Check sugars 6 times daily and per protocol for hyper and hypoglycemia    Dispense:  250 each    Refill:  3    For use with Bayer lancet device  . fluconazole (DIFLUCAN) 150 MG tablet    Sig: Take 1 tablet (150 mg total) by mouth once for 1 dose.    Dispense:  1 tablet    Refill:  0    Smitty Cords, MD [facility], PGY-3

## 2017-06-11 ENCOUNTER — Ambulatory Visit: Payer: BLUE CROSS/BLUE SHIELD | Admitting: Family Medicine

## 2017-06-11 ENCOUNTER — Other Ambulatory Visit: Payer: Self-pay

## 2017-06-11 ENCOUNTER — Encounter: Payer: Self-pay | Admitting: Family Medicine

## 2017-06-11 VITALS — BP 122/84 | HR 81 | Temp 98.2°F | Ht 62.0 in | Wt 94.2 lb

## 2017-06-11 DIAGNOSIS — E118 Type 2 diabetes mellitus with unspecified complications: Secondary | ICD-10-CM | POA: Diagnosis not present

## 2017-06-11 DIAGNOSIS — N898 Other specified noninflammatory disorders of vagina: Secondary | ICD-10-CM | POA: Diagnosis not present

## 2017-06-11 LAB — POCT GLYCOSYLATED HEMOGLOBIN (HGB A1C)

## 2017-06-11 MED ORDER — FLUCONAZOLE 150 MG PO TABS: 150.0000 mg | ORAL_TABLET | Freq: Once | ORAL | 0 refills | Status: AC

## 2017-06-11 MED ORDER — GLUCOSE BLOOD VI STRP: ORAL_STRIP | 12 refills | Status: DC

## 2017-06-11 MED ORDER — LISINOPRIL 20 MG PO TABS: 20.0000 mg | ORAL_TABLET | Freq: Every day | ORAL | 0 refills | Status: DC

## 2017-06-11 MED ORDER — INSULIN GLARGINE 100 UNIT/ML SOLOSTAR PEN: 10.0000 [IU] | PEN_INJECTOR | Freq: Every day | SUBCUTANEOUS | 99 refills | Status: DC

## 2017-06-11 MED ORDER — METFORMIN HCL 500 MG PO TABS: ORAL_TABLET | ORAL | 0 refills | Status: DC

## 2017-06-11 MED ORDER — BAYER CONTOUR LINK MONITOR W/DEVICE KIT
1.0000 | PACK | Freq: Two times a day (BID) | 1 refills | Status: AC
Start: 2017-06-11 — End: ?

## 2017-06-11 MED ORDER — BAYER MICROLET LANCETS MISC: 3 refills | Status: AC

## 2017-06-11 NOTE — Patient Instructions (Signed)
Thank you for coming in today, it was so nice to see you! Today we talked about:    Diabetes: You were seen today for diabetes.  This is likely causing all of your symptoms that you are having.  We are checking your blood work today.  Start Lantus tonight, 10 units.   Check your sugar every morning.  As long as your morning fasting sugar is above 120 go up 1 unit of Lantus each night  Please follow up next week. You can schedule this appointment at the front desk before you leave or call the clinic.  Bring in all your medications or supplements to each appointment for review.   If we ordered any tests today, you will be notified via telephone of any abnormalities. If everything is normal you will get a letter in the mail.   If you have any questions or concerns, please do not hesitate to call the office at [phone]. You can also message me directly via MyChart.   Sincerely,  Anders Simmondshristina Lerry Cordrey, MD

## 2017-06-12 ENCOUNTER — Telehealth: Payer: Self-pay | Admitting: Internal Medicine

## 2017-06-12 LAB — BASIC METABOLIC PANEL
BUN / CREAT RATIO: 19 (ref 9–23)
BUN: 18 mg/dL (ref 6–24)
CALCIUM: 10.1 mg/dL (ref 8.7–10.2)
CHLORIDE: 87 mmol/L — AB (ref 96–106)
CO2: 24 mmol/L (ref 20–29)
Creatinine, Ser: 0.93 mg/dL (ref 0.57–1.00)
GFR, EST AFRICAN AMERICAN: 78 mL/min/{1.73_m2} (ref >59)
GFR, EST NON AFRICAN AMERICAN: 68 mL/min/{1.73_m2} (ref >59)
Glucose: 746 mg/dL (ref 65–99)
POTASSIUM: 5.5 mmol/L — AB (ref 3.5–5.2)
SODIUM: 127 mmol/L — AB (ref 134–144)

## 2017-06-12 LAB — C-PEPTIDE: C-Peptide: 1.8 ng/mL (ref 1.1–4.4)

## 2017-06-12 NOTE — Telephone Encounter (Signed)
Received page to Emergency After-Hours Line from [facility] about critical lab value. Patient glucose was 746 on BMP yesterday. She also appears to have an anion gap. Left message for patient with this information and that I would recommend going to the emergency room to be evaluated, as she may require admission and an insulin drip. Also tried to reach patient's husband who is listed as emergency contact but no answer or option to leave a message.  Dani GobbleHillary Cable Fearn, MD [facility], PGY-3

## 2017-06-14 NOTE — Assessment & Plan Note (Signed)
Very poorly controlled.  Patient contributes no insurance as to her reason why not following up or taking her medications.  A1c today >15.  Does not have any classic DKA symptoms however given her very high A1c will get a BMP today.  She is also thin which is unusual for type 2 diabetes, will obtain C-peptide.  Discussed with patient the severity of her diabetes and need for follow-up.  She agrees to follow-up more frequently now that she has insurance again. -Begin Lantus 10 units nightly, titrate up 1 unit every night as long as fasting glucoses above 120 (she has taken insulin before and is familiar with administering it to herself) -Metformin 1000 mg twice daily -Sent supplies to pharmacy; glucometer, glucose test strips, lancets -Follow-up in 5 days to review fasting glucoses and make medication adjustments if needed

## 2017-06-16 ENCOUNTER — Telehealth: Payer: Self-pay | Admitting: Family Medicine

## 2017-06-16 NOTE — Progress Notes (Signed)
Subjective:    Patient ID: [name] , female   DOB: April 11, 1960 , 57 y.o..   MRN: [id number]  HPI  [name] is a 57 yo F with PMH of HTN, HLD, and DM II, anxiety here for  Chief Complaint  Patient presents with  . Follow-up    1. Chronic Diabetes Disease Monitoring  Blood Sugar Ranges: Has had a few blood glucoses in the 60s in the morning, yesterday her fasting CBG was 63.  Otherwise her CBGs have ranged from 80-130.  She has not had to go up on her Lantus at all.  Polyuria: no   Visual problems: yes feels that time she has tunnel vision,   Last hemoglobin A1C:  Lab Results  Component Value Date   HGBA1C >15.0 06/11/2017   Medication Compliance: yes  Medication Side Effects  Hypoglycemia: yes, sometimes glucose was in the 60s  Preventitive Health Care  Eye Exam: Due  Foot Exam: Due  Diet pattern: Does not eat very well.  She notes that sometimes she will not eat all day.    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  Crack and marijuana use: Patient admits to smoking marijuana with crack rocks.  She notes that she lasted this today before her appointment.  She notes that she feels like the people she hangs out with informed her to do this.  She would like to quit.   Review of Systems: Per HPI. All other systems reviewed and are negative.   Past Medical History: Patient Active Problem List   Diagnosis Date Noted  . Illicit drug use 06/17/2017  . Frequent No-show for appointment 01/30/2016  . Anxiety and depression 08/26/2015  . Preventative health care 03/06/2014  . Keloid 07/20/2013  . Osteoporosis, unspecified 01/03/2013  . Wrist pain, chronic 08/16/2012  . Deformity, wrist acquired 08/16/2012  . History of hepatitis C 10/29/2011  . Diabetes (HCC) 10/29/2011  . Hypertension 10/29/2011  . Hyperlipidemia 10/29/2011     Social Hx:  reports that she has never smoked. She has never used smokeless tobacco.   Objective:   BP 122/80    Pulse (!) 108   Temp 97.7 F (36.5 C) (Oral)   Ht 5\' 2"  (1.575 m)   Wt 89 lb 9.6 oz (40.6 kg)   SpO2 99%   BMI 16.39 kg/m  Physical Exam  Gen: NAD, alert, cooperative with exam, thin, Psych: Anxious and tearful  Assessment & Plan:  Diabetes [facility]) Patient has done an excellent job at checking her glucoses been taking her insulin.  She has had fasting CBGs into the 60s, this is likely secondary to her skipping meals.  She appears thin and admits to smoking crack rocks (see separate assessment and plan for this). -Continue Lantus 10 units nightly only if she eats 3 meals a day and a snack before bed -Discussed if her glucose is below 60 in the morning that she will need to have a high sugar snack and go down 1 unit on her Lantus at night -Continue to check CBGs, fasting - Referral placed for ophthalmology for diabetic eye exam  Illicit drug use Patient appears thin on exam.  When asked if she is using any substances she did admit to marijuana and crack rocks.  She does want to quit.  Was able to have our social worker Gavin Pound speak to patient and provide her with resources to help overcome drug abuse.  Patient was appreciative.  Orders  Placed This Encounter  Procedures  . Basic Metabolic Panel  . TSH  . VITAMIN D 25 Hydroxy (Vit-D Deficiency, Fractures)  . Ambulatory referral to Ophthalmology    Referral Priority:   Routine    Referral Type:   Consultation    Referral Reason:   Specialty Services Required    Requested Specialty:   Ophthalmology    Number of Visits Requested:   1   Meds ordered this encounter  Medications  . pravastatin (PRAVACHOL) 20 MG tablet    Sig: Take 1 tablet (20 mg total) by mouth daily.    Dispense:  30 tablet    Refill:  5  . glucose blood (ACCU-CHEK AVIVA) test strip    Sig: Check sugar daily    Dispense:  200 each    Refill:  3    For questions regarding this prescription please call [phone]    Anders Simmondshristina Martine Trageser, MD [facility], PGY-3

## 2017-06-16 NOTE — Telephone Encounter (Signed)
Pt needs a dr's note stating her reason for being out of work Last Friday (4/12), Monday, Tuesday, and today, Wednesday (4/15-4/17). Please let pt know when it will be ready to pick up.

## 2017-06-17 ENCOUNTER — Encounter: Payer: Self-pay | Admitting: Licensed Clinical Social Worker

## 2017-06-17 ENCOUNTER — Other Ambulatory Visit: Payer: Self-pay

## 2017-06-17 ENCOUNTER — Ambulatory Visit: Payer: BLUE CROSS/BLUE SHIELD | Admitting: Family Medicine

## 2017-06-17 ENCOUNTER — Encounter: Payer: Self-pay | Admitting: Family Medicine

## 2017-06-17 VITALS — BP 122/80 | HR 108 | Temp 97.7°F | Ht 62.0 in | Wt 89.6 lb

## 2017-06-17 DIAGNOSIS — E1165 Type 2 diabetes mellitus with hyperglycemia: Secondary | ICD-10-CM

## 2017-06-17 DIAGNOSIS — Z794 Long term (current) use of insulin: Secondary | ICD-10-CM | POA: Diagnosis not present

## 2017-06-17 DIAGNOSIS — E119 Type 2 diabetes mellitus without complications: Secondary | ICD-10-CM | POA: Diagnosis not present

## 2017-06-17 DIAGNOSIS — F1990 Other psychoactive substance use, unspecified, uncomplicated: Secondary | ICD-10-CM

## 2017-06-17 DIAGNOSIS — I10 Essential (primary) hypertension: Secondary | ICD-10-CM

## 2017-06-17 DIAGNOSIS — E559 Vitamin D deficiency, unspecified: Secondary | ICD-10-CM | POA: Diagnosis not present

## 2017-06-17 MED ORDER — PRAVASTATIN SODIUM 20 MG PO TABS: 20.0000 mg | ORAL_TABLET | Freq: Every day | ORAL | 5 refills | Status: DC

## 2017-06-17 MED ORDER — GLUCOSE BLOOD VI STRP: ORAL_STRIP | 3 refills | Status: DC

## 2017-06-17 NOTE — Assessment & Plan Note (Addendum)
Patient appears thin on exam.  When asked if she is using any substances she did admit to marijuana and crack rocks.  She does want to quit.  Was able to have our social worker Gavin PoundDeborah speak to patient and provide her with resources to help overcome drug abuse.  Patient was appreciative.

## 2017-06-17 NOTE — Assessment & Plan Note (Signed)
Patient has done an excellent job at checking her glucoses been taking her insulin.  She has had fasting CBGs into the 60s, this is likely secondary to her skipping meals.  She appears thin and admits to smoking crack rocks (see separate assessment and plan for this). -Continue Lantus 10 units nightly only if she eats 3 meals a day and a snack before bed -Discussed if her glucose is below 60 in the morning that she will need to have a high sugar snack and go down 1 unit on her Lantus at night -Continue to check CBGs, fasting - Referral placed for ophthalmology for diabetic eye exam

## 2017-06-17 NOTE — Patient Instructions (Signed)
Thank you for coming in today, it was so nice to see you! Today we talked about:    Diabetes: Continue to take 10 units of insulin every night.  If your sugars are below 60 even when you are eating 3 meals a day and a snack at night, go down to 1 unit until each night until your fasting glucose is above 80  I have placed a referral for you to see an eye doctor, so we will call you to schedule this  You will need to drink plenty of water to keep yourself hydrated, you want to drink water to your urine is clear to light yellow  We have ordered some blood work today, I will call you if anything is not normal  Please try and stop smoking crack rocks as this is detrimental to your health in is not allowing you to have good sugar control or weight.  Teresa PoundDeborah saw you today and provided you with some resources.  Please follow up in 3 weeks. You can schedule this appointment at the front desk before you leave or call the clinic.  Bring in all your medications or supplements to each appointment for review.   If we ordered any tests today, you will be notified via telephone of any abnormalities. If everything is normal you will get a letter in the mail.   If you have any questions or concerns, please do not hesitate to call the office at [phone]. You can also message me directly via MyChart.   Sincerely,  Anders Simmondshristina Gambino, MD

## 2017-06-17 NOTE — Progress Notes (Signed)
Type of Service: Integrated Behavioral Health Warm Handoff  [name] is a 57 y.o. female referred by Dr. Jonathon JordanGambino for resources to assist patient with substance use. Patient reports :concerns with Substance abuse for may years and now wants to make a change in her life.  Life & Social patient lives with 3 grandchildren ,works PT at [employer]  Recent llfe changes: none reported Issues discussed:  Integrated care services, support system, previous and current coping skills, community resources , things patient enjoy or use to enjoy doing, previous treatment and what worked.  Discussed IOP program and various agencies, Intervention: Reflective listening, supportive counseling,  relaxed breathing Referral to Substance Abuse Program ; Motivational Interviewing Assessment/Plan:.Patient wants to make changes in her life and ready to seek help with substance treatment, she may benefit from, and is in agreement to receive further assessment and therapeutic interventions via Intensive Out Patient programs to assist with managing her desire to use.   1. Patient will contact ( [facility], [facility] or [facility]) 2. LCSW will F/U with patient in 7 to 10 business days  3. Patient will call LCSW if needed.   Warm Hand Off Completed.     Sammuel Hineseborah Crystalynn Mcinerney, LCSW Licensed Clinical Social Worker [facility]   [phone] 3:25 PM

## 2017-06-18 LAB — BASIC METABOLIC PANEL
BUN/Creatinine Ratio: 20 (ref 9–23)
BUN: 24 mg/dL (ref 6–24)
CO2: 22 mmol/L (ref 20–29)
Calcium: 10.3 mg/dL — ABNORMAL HIGH (ref 8.7–10.2)
Chloride: 91 mmol/L — ABNORMAL LOW (ref 96–106)
Creatinine, Ser: 1.22 mg/dL — ABNORMAL HIGH (ref 0.57–1.00)
GFR, EST AFRICAN AMERICAN: 56 mL/min/{1.73_m2} — AB (ref >59)
GFR, EST NON AFRICAN AMERICAN: 49 mL/min/{1.73_m2} — AB (ref >59)
Glucose: 473 mg/dL — ABNORMAL HIGH (ref 65–99)
POTASSIUM: 4.9 mmol/L (ref 3.5–5.2)
SODIUM: 132 mmol/L — AB (ref 134–144)

## 2017-06-18 LAB — VITAMIN D 25 HYDROXY (VIT D DEFICIENCY, FRACTURES): VIT D 25 HYDROXY: 16.2 ng/mL — AB (ref 30.0–100.0)

## 2017-06-18 LAB — TSH: TSH: 1.12 u[IU]/mL (ref 0.450–4.500)

## 2017-06-23 ENCOUNTER — Encounter: Payer: Self-pay | Admitting: Family Medicine

## 2017-06-23 ENCOUNTER — Telehealth: Payer: Self-pay

## 2017-06-23 ENCOUNTER — Telehealth: Payer: Self-pay | Admitting: Family Medicine

## 2017-06-23 NOTE — Telephone Encounter (Signed)
Pt calling requesting rx for Bayer Contour Next test strips to her pharmacy. Call back [phone]

## 2017-06-23 NOTE — Telephone Encounter (Signed)
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 Pt said she missed the call a few minutes ago and she believes it was concerning her test results. Please call pt back to discuss

## 2017-06-24 MED ORDER — VITAMIN D 1000 UNITS PO TABS: 1000.0000 [IU] | ORAL_TABLET | Freq: Every day | ORAL | 3 refills | Status: DC

## 2017-06-24 MED ORDER — GLUCOSE BLOOD VI STRP: ORAL_STRIP | 12 refills | Status: DC

## 2017-06-24 MED ORDER — PRAVASTATIN SODIUM 20 MG PO TABS: 20.0000 mg | ORAL_TABLET | Freq: Every day | ORAL | 5 refills | Status: AC

## 2017-06-24 NOTE — Telephone Encounter (Signed)
Returned patients call, discussed her lab results. Sent in rx for vitamin D as well as glucose test strips. She was appreciative of the call.   Anders Simmondshristina Gambino, MD [facility], PGY-3

## 2017-06-28 ENCOUNTER — Other Ambulatory Visit: Payer: Self-pay

## 2017-06-28 MED ORDER — LISINOPRIL 20 MG PO TABS: 20.0000 mg | ORAL_TABLET | Freq: Every day | ORAL | 2 refills | Status: DC

## 2017-07-01 ENCOUNTER — Telehealth: Payer: Self-pay | Admitting: Licensed Clinical Social Worker

## 2017-07-01 ENCOUNTER — Other Ambulatory Visit: Payer: Self-pay

## 2017-07-01 MED ORDER — METFORMIN HCL 500 MG PO TABS: ORAL_TABLET | ORAL | 0 refills | Status: DC

## 2017-07-01 NOTE — Progress Notes (Signed)
Service : Integrated Behavioral Health F/U Call   F/U call to patient reference interventions discussed and resources provided during warm handoff from PCP.  Patient has an appointment tomorrow with [facility] to address her substance use. Patient appreciative of F/U call. Patient also informed LCSW she needs a refill on Metformin.  LCSW sent in-basket message to PCP.  Sammuel Hines, LCSW Licensed Clinical Social Worker [facility]   [phone] 2:38 PM

## 2017-07-01 NOTE — Telephone Encounter (Signed)
Pt requesting refill of Metformin to [facility] Call back [phone] Shawna Orleans, RN

## 2017-07-06 ENCOUNTER — Other Ambulatory Visit: Payer: Self-pay

## 2017-07-06 NOTE — Telephone Encounter (Signed)
Checking to see if you have already sent this? Thanks Shawna Orleans, RN

## 2017-07-07 MED ORDER — GLUCOSE BLOOD VI STRP: ORAL_STRIP | 12 refills | Status: DC

## 2017-07-07 MED ORDER — LISINOPRIL 20 MG PO TABS: 20.0000 mg | ORAL_TABLET | Freq: Every day | ORAL | 2 refills | Status: DC

## 2017-07-07 NOTE — Telephone Encounter (Signed)
Rx has been sent  

## 2017-07-08 NOTE — Progress Notes (Deleted)
   Subjective:    Patient ID: [name] , female   DOB: 1960/11/29 , 57 y.o..   MRN: [id number]  HPI  [name] is a 57 yo F with PMH of HTN, HLD, and DM II, anxiety here for  No chief complaint on file.   1. Chronic Diabetes Disease Monitoring  Blood Sugar Ranges: Has had a few blood glucoses in the 60s in the morning, yesterday her fasting CBG was 63.  Otherwise her CBGs have ranged from 80-130.  She has not had to go up on her Lantus at all.  Polyuria: no   Visual problems: yes feels that time she has tunnel vision,   Last hemoglobin A1C:  Lab Results  Component Value Date   HGBA1C >15.0 06/11/2017   Medication Compliance: yes  Medication Side Effects  Hypoglycemia: yes, sometimes glucose was in the 60s  Preventitive Health Care  Eye Exam: Due  Foot Exam: Due  Diet pattern: Does not eat very well.  She notes that sometimes she will not eat all day.    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  Crack and marijuana use: Patient admits to smoking marijuana with crack rocks.  She notes that she lasted this today before her appointment.  She notes that she feels like the people she hangs out with informed her to do this.  She would like to quit.   Review of Systems: Per HPI. All other systems reviewed and are negative.   Past Medical History: Patient Active Problem List   Diagnosis Date Noted  . Illicit drug use 06/17/2017  . Frequent No-show for appointment 01/30/2016  . Anxiety and depression 08/26/2015  . Keloid 07/20/2013  . Osteoporosis, unspecified 01/03/2013  . Wrist pain, chronic 08/16/2012  . Deformity, wrist acquired 08/16/2012  . History of hepatitis C 10/29/2011  . Diabetes (HCC) 10/29/2011  . Hypertension 10/29/2011  . Hyperlipidemia 10/29/2011     Social Hx:  reports that she has never smoked. She has never used smokeless tobacco.   Objective:   There were no vitals taken for this visit. Physical Exam  Gen: NAD, alert,  cooperative with exam, thin, Psych: Anxious and tearful  Assessment & Plan:  No problem-specific Assessment & Plan notes found for this encounter.  No orders of the defined types were placed in this encounter.  No orders of the defined types were placed in this encounter.   Anders Simmonds, MD [facility], PGY-3

## 2017-07-09 ENCOUNTER — Ambulatory Visit: Payer: BLUE CROSS/BLUE SHIELD | Admitting: Family Medicine

## 2017-07-13 ENCOUNTER — Ambulatory Visit: Payer: BLUE CROSS/BLUE SHIELD | Admitting: Family Medicine

## 2017-07-13 NOTE — Progress Notes (Deleted)
   Subjective:    Patient ID: [name] , female   DOB: [birth year] , 57 y.o..   MRN: [id number]  HPI  [name] is here for No chief complaint on file.   1. ***  Review of Systems: Per HPI. All other systems reviewed and are negative.  Health Maintenance Due  Topic Date Due  . PAP SMEAR  04/14/1980  . MAMMOGRAM  04/14/2009  . COLONOSCOPY  04/14/2009  . OPHTHALMOLOGY EXAM  10/19/2014  . FOOT EXAM  03/07/2015  . TETANUS/TDAP  04/02/2015    Past Medical History: Patient Active Problem List   Diagnosis Date Noted  . Illicit drug use 99/83/3825  . Frequent No-show for appointment 01/30/2016  . Anxiety and depression 08/26/2015  . Keloid 07/20/2013  . Osteoporosis, unspecified 01/03/2013  . Wrist pain, chronic 08/16/2012  . Deformity, wrist acquired 08/16/2012  . History of hepatitis C 10/29/2011  . Diabetes ([place]) 10/29/2011  . Hypertension 10/29/2011  . Hyperlipidemia 10/29/2011    Medications: reviewed and updated Current Outpatient Medications  Medication Sig Dispense Refill  . alendronate (FOSAMAX) 70 MG tablet Take 70 mg by mouth once a week. Reported on 08/26/2015    . BAYER MICROLET LANCETS lancets Check sugars 6 times daily and per protocol for hyper and hypoglycemia 250 each 3  . Blood Glucose Monitoring Suppl (BAYER CONTOUR LINK MONITOR) w/Device KIT 1 application by Does not apply route 2 (two) times daily. 1 kit 1  . cholecalciferol (VITAMIN D) 1000 units tablet Take 1 tablet (1,000 Units total) by mouth daily. 30 tablet 3  . FLUoxetine (PROZAC) 20 MG capsule Take 1 capsule (20 mg total) by mouth daily. (Patient not taking: Reported on 02/17/2016) 30 capsule 3  . glucose blood (CONTOUR NEXT TEST) test strip Use as instructed 100 each 12  . Insulin Glargine (LANTUS SOLOSTAR) 100 UNIT/ML Solostar Pen Inject 10 Units into the skin daily at 10 pm. 5 pen PRN  . lisinopril (PRINIVIL,ZESTRIL) 20 MG tablet Take 1 tablet (20 mg total) by mouth daily. 30 tablet 2  .  metFORMIN (GLUCOPHAGE) 500 MG tablet TAKE 1 TABLET BY MOUTH TWICE DAILY WITH A MEAL 90 tablet 0  . pravastatin (PRAVACHOL) 20 MG tablet Take 1 tablet (20 mg total) by mouth daily. 30 tablet 5  . triamcinolone cream (KENALOG) 0.1 % Apply 1 application topically 2 (two) times daily. Perform for 14 days. (Patient not taking: Reported on 02/17/2016) 85.2 g 2   No current facility-administered medications for this visit.     Social Hx:  reports that she has never smoked. She has never used smokeless tobacco.   Objective:   There were no vitals taken for this visit. Physical Exam  Gen: NAD, alert, cooperative with exam, well-appearing HEENT: NCAT, PERRL, clear conjunctiva, oropharynx clear, supple neck Cardiac: Regular rate and rhythm, normal S1/S2, no murmur, no edema, capillary refill brisk  Respiratory: Clear to auscultation bilaterally, no wheezes, non-labored breathing Gastrointestinal: soft, non tender, non distended, bowel sounds present Skin: no rashes, normal turgor  Neurological: no gross deficits.  Psych: good insight, normal mood and affect  Assessment & Plan:  No problem-specific Assessment & Plan notes found for this encounter.  No orders of the defined types were placed in this encounter.  No orders of the defined types were placed in this encounter.   Smitty Cords, MD [place], PGY-3

## 2017-08-02 ENCOUNTER — Ambulatory Visit: Payer: BLUE CROSS/BLUE SHIELD | Admitting: Family Medicine

## 2017-08-02 NOTE — Progress Notes (Deleted)
   Subjective:    Patient ID: [name] , female   DOB: 06/20/1960 , 57 y.o..   MRN: [id number]  HPI  [name] is a 57 yo F with PMH of HTN, HLD, and DM II, anxiety here for  No chief complaint on file.   1. Chronic Diabetes Disease Monitoring  Blood Sugar Ranges: Has had a few blood glucoses in the 60s in the morning, yesterday her fasting CBG was 63.  Otherwise her CBGs have ranged from 80-130.  She has not had to go up on her Lantus at all.  Polyuria: no   Visual problems: yes feels that time she has tunnel vision,   Last hemoglobin A1C:  Lab Results  Component Value Date   HGBA1C >15.0 06/11/2017   Medication Compliance: yes  Medication Side Effects  Hypoglycemia: yes, sometimes glucose was in the 60s  Preventitive Health Care  Eye Exam: Due  Foot Exam: Due  Diet pattern: Does not eat very well.  She notes that sometimes she will not eat all day.    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  Crack and marijuana use: Patient admits to smoking marijuana with crack rocks.  She notes that she lasted this today before her appointment.  She notes that she feels like the people she hangs out with informed her to do this.  She would like to quit.   Review of Systems: Per HPI. All other systems reviewed and are negative.   Past Medical History: Patient Active Problem List   Diagnosis Date Noted  . Illicit drug use 06/17/2017  . Frequent No-show for appointment 01/30/2016  . Anxiety and depression 08/26/2015  . Keloid 07/20/2013  . Osteoporosis, unspecified 01/03/2013  . Wrist pain, chronic 08/16/2012  . Deformity, wrist acquired 08/16/2012  . History of hepatitis C 10/29/2011  . Diabetes (HCC) 10/29/2011  . Hypertension 10/29/2011  . Hyperlipidemia 10/29/2011     Social Hx:  reports that she has never smoked. She has never used smokeless tobacco.   Objective:   There were no vitals taken for this visit. Physical Exam  Gen: NAD, alert,  cooperative with exam, thin, Psych: Anxious and tearful  Assessment & Plan:  No problem-specific Assessment & Plan notes found for this encounter.  No orders of the defined types were placed in this encounter.  No orders of the defined types were placed in this encounter.   Anders Simmondshristina Graysen Depaula, MD [facility], PGY-3

## 2017-08-13 ENCOUNTER — Encounter: Payer: Self-pay | Admitting: Family Medicine

## 2017-08-13 ENCOUNTER — Ambulatory Visit: Payer: BLUE CROSS/BLUE SHIELD | Admitting: Family Medicine

## 2017-08-13 ENCOUNTER — Other Ambulatory Visit: Payer: Self-pay

## 2017-08-13 VITALS — BP 130/76 | HR 94 | Temp 97.8°F | Ht 62.0 in | Wt 92.2 lb

## 2017-08-13 DIAGNOSIS — E559 Vitamin D deficiency, unspecified: Secondary | ICD-10-CM

## 2017-08-13 DIAGNOSIS — B3731 Acute candidiasis of vulva and vagina: Secondary | ICD-10-CM

## 2017-08-13 DIAGNOSIS — B373 Candidiasis of vulva and vagina: Secondary | ICD-10-CM | POA: Diagnosis not present

## 2017-08-13 DIAGNOSIS — F1990 Other psychoactive substance use, unspecified, uncomplicated: Secondary | ICD-10-CM | POA: Diagnosis not present

## 2017-08-13 DIAGNOSIS — Z794 Long term (current) use of insulin: Secondary | ICD-10-CM

## 2017-08-13 DIAGNOSIS — E1165 Type 2 diabetes mellitus with hyperglycemia: Secondary | ICD-10-CM | POA: Diagnosis not present

## 2017-08-13 DIAGNOSIS — F419 Anxiety disorder, unspecified: Secondary | ICD-10-CM

## 2017-08-13 DIAGNOSIS — I10 Essential (primary) hypertension: Secondary | ICD-10-CM | POA: Diagnosis not present

## 2017-08-13 MED ORDER — ESCITALOPRAM OXALATE 5 MG PO TABS: 5.0000 mg | ORAL_TABLET | Freq: Every day | ORAL | 0 refills | Status: DC

## 2017-08-13 MED ORDER — FLUCONAZOLE 150 MG PO TABS: 150.0000 mg | ORAL_TABLET | Freq: Once | ORAL | 0 refills | Status: AC

## 2017-08-13 NOTE — Patient Instructions (Addendum)
Thank you for coming in today, it was so nice to see you! Today we talked about:    Diabetes: Continue to go up by 1 unit of Lantus every day as long as your morning blood sugars are greater than 150.  Continue checking your sugars every morning  We are checking your vitamin D today and your sugar  Please call the number on the yellow paper to schedule an appointment with the behavioral health consultant  Please follow up in 1 month for diabetes and anxiety. You can schedule this appointment at the front desk before you leave or call the clinic. If you would like to discuss your rash, please schedule an appointment sooner.  Bring in all your medications or supplements to each appointment for review.   If we ordered any tests today, you will be notified via telephone of any abnormalities. If everything is normal you will get a letter in the mail.   If you have any questions or concerns, please do not hesitate to call the office at [phone]. You can also message me directly via MyChart.   Sincerely,  Anders Simmondshristina Roselee Tayloe, MD

## 2017-08-13 NOTE — Progress Notes (Signed)
Subjective:    Patient ID: [name] , female   DOB: 1960/06/11 , 57 y.o..   MRN: [id number]  HPI  [name] is a 57 yo F with PMH of HTN, HLD, and DM II, anxiety  here for  1. Chronic Diabetes Disease Monitoring  Blood Sugar Ranges: 300-400  Polyuria: yes   Visual problems: no   Last hemoglobin A1C:  Lab Results  Component Value Date   HGBA1C >15.0 06/11/2017    Medication Compliance: Most of the time.  She notes that sometimes she does not take her insulin because she is worried there is a bubble in the needle.  Up to 18 units of Lantus daily Medication Side Effects  Hypoglycemia: no   2. Vaginal itching: Patient endorses vaginal itching for last couple weeks.  She notes that it is consistent with previous yeast infections that she has had.  Denies any vaginal discharge or bleeding.  Is postmenopausal.  3. Anxiety: Patient endorses some anxiety symptoms.  She notes she has had anxiety her entire life and that is what is causing her to use crack and marijuana.  She notes that she is trying to quit marijuana and crack but it is difficult because of her anxiety.  She notes that she had some dysfunction in her childhood that caused her to have anxiety.  Was previously prescribed Prozac but she does not take that.  She has never been diagnosed with any other psychiatric condition.  4. Substance Abuse: Patient notes that she has continued to use crack and marijuana, her last use was yesterday.  She is following with a regular center to try and quit.  Review of Systems: Per HPI.    Past Medical History: Patient Active Problem List   Diagnosis Date Noted  . Illicit drug use 06/17/2017  . Frequent No-show for appointment 01/30/2016  . Anxiety and depression 08/26/2015  . Keloid 07/20/2013  . Osteoporosis, unspecified 01/03/2013  . Wrist pain, chronic 08/16/2012  . Deformity, wrist acquired 08/16/2012  . History of hepatitis C 10/29/2011  . Diabetes (HCC) 10/29/2011  .  Hypertension 10/29/2011  . Hyperlipidemia 10/29/2011    Medications: reviewed   Social Hx:  reports that she has never smoked. She has never used smokeless tobacco.   Objective:   BP 130/76   Pulse 94   Temp 97.8 F (36.6 C) (Oral)   Ht 5\' 2"  (1.575 m)   Wt 92 lb 3.2 oz (41.8 kg)   SpO2 98%   BMI 16.86 kg/m  Physical Exam  Gen: NAD, alert, cooperative with exam, well-appearing Neurological: no gross deficits.  Psych: Anxious, rapid speech  Assessment & Plan:   1. Type 2 diabetes mellitus with hyperglycemia, with long-term current use of insulin (HCC): Uncontrolled per last A1c >15 in April.  Reviewed CBGs today, they are in the 300-400 range. She stopped titrated her Lantus up when she reached 18 units, she could not say why.  - Continue 18 units of Lantus nightly and titrate up 1 unit every night as long as fasting CBGs are above 150 -Continue metformin - Follow-up in 1 month for next A1c check -She may benefit from seeing Dr. Raymondo Band for diabetes assistance  2. Vitamin D deficiency: Vitamin D low normal at 29.3.  Site is okay with him doing - VITAMIN D 25 Hydroxy (Vit-D Deficiency, Fractures)  3. Essential hypertension: Normotensive and at goal -Continue current medication regimen: Lisinopril 20 mg daily - Basic Metabolic Panel -Follow-up in 3  months  4. Vaginal yeast infection: Patient endorsing vaginal itching and discharge, consistent with her previous yeast infections. Likely worsened due to uncontrolled Type 2 Diabetes -Diflucan 150 mg once  5. Illicit drug use: Patient continues to use crack and marijuana, she is interested in quitting.   -Continue following with substance abuse center -Provided encouragement to patient to quit  6. Anxiety: Patient endorsing intermittent anxiety due to previous childhood trauma. GAD 7 of 14.  She is asking for a medication to help her. She appears anxious and has rapid speech. There could be some underlying manic symptoms however  difficult to assess due to crack use -Start Lexapro 5 mg daily  -Would avoid benzos  -Asked her to follow-up in 1 month to see how she is doing on this medication -Provided patient with behavioral health consultants number so she can schedule appointment with them  Anders Simmondshristina Gambino, MD [facility], PGY-3

## 2017-08-14 LAB — BASIC METABOLIC PANEL
BUN / CREAT RATIO: 16 (ref 9–23)
BUN: 14 mg/dL (ref 6–24)
CHLORIDE: 95 mmol/L — AB (ref 96–106)
CO2: 24 mmol/L (ref 20–29)
Calcium: 9.7 mg/dL (ref 8.7–10.2)
Creatinine, Ser: 0.88 mg/dL (ref 0.57–1.00)
GFR calc Af Amer: 84 mL/min/{1.73_m2} (ref >59)
GFR calc non Af Amer: 73 mL/min/{1.73_m2} (ref >59)
GLUCOSE: 334 mg/dL — AB (ref 65–99)
Potassium: 4.4 mmol/L (ref 3.5–5.2)
Sodium: 138 mmol/L (ref 134–144)

## 2017-08-14 LAB — VITAMIN D 25 HYDROXY (VIT D DEFICIENCY, FRACTURES): VIT D 25 HYDROXY: 29.3 ng/mL — AB (ref 30.0–100.0)

## 2017-08-19 ENCOUNTER — Ambulatory Visit: Payer: BLUE CROSS/BLUE SHIELD

## 2017-09-06 ENCOUNTER — Other Ambulatory Visit: Payer: Self-pay

## 2017-09-06 NOTE — Telephone Encounter (Signed)
Pt called nurse line requesting refills on pended medications. Please advise.

## 2017-09-07 MED ORDER — INSULIN GLARGINE 100 UNIT/ML SOLOSTAR PEN: 10.0000 [IU] | PEN_INJECTOR | Freq: Every day | SUBCUTANEOUS | 99 refills | Status: DC

## 2017-09-07 MED ORDER — LISINOPRIL 20 MG PO TABS: 20.0000 mg | ORAL_TABLET | Freq: Every day | ORAL | 2 refills | Status: DC

## 2017-09-07 MED ORDER — METFORMIN HCL 500 MG PO TABS: ORAL_TABLET | ORAL | 0 refills | Status: DC

## 2017-10-07 ENCOUNTER — Ambulatory Visit: Payer: BLUE CROSS/BLUE SHIELD | Admitting: Family Medicine

## 2017-11-05 ENCOUNTER — Ambulatory Visit: Payer: BLUE CROSS/BLUE SHIELD | Admitting: Family Medicine

## 2017-11-05 ENCOUNTER — Other Ambulatory Visit: Payer: Self-pay

## 2017-11-05 ENCOUNTER — Encounter: Payer: Self-pay | Admitting: Family Medicine

## 2017-11-05 VITALS — BP 148/92 | Temp 98.3°F | Ht 62.0 in | Wt 92.2 lb

## 2017-11-05 DIAGNOSIS — F329 Major depressive disorder, single episode, unspecified: Secondary | ICD-10-CM

## 2017-11-05 DIAGNOSIS — I10 Essential (primary) hypertension: Secondary | ICD-10-CM

## 2017-11-05 DIAGNOSIS — E1165 Type 2 diabetes mellitus with hyperglycemia: Secondary | ICD-10-CM

## 2017-11-05 DIAGNOSIS — Z23 Encounter for immunization: Secondary | ICD-10-CM | POA: Diagnosis not present

## 2017-11-05 DIAGNOSIS — F1990 Other psychoactive substance use, unspecified, uncomplicated: Secondary | ICD-10-CM | POA: Diagnosis not present

## 2017-11-05 DIAGNOSIS — F419 Anxiety disorder, unspecified: Secondary | ICD-10-CM | POA: Diagnosis not present

## 2017-11-05 DIAGNOSIS — Z794 Long term (current) use of insulin: Secondary | ICD-10-CM

## 2017-11-05 DIAGNOSIS — F32A Depression, unspecified: Secondary | ICD-10-CM

## 2017-11-05 DIAGNOSIS — B3731 Acute candidiasis of vulva and vagina: Secondary | ICD-10-CM | POA: Insufficient documentation

## 2017-11-05 DIAGNOSIS — B373 Candidiasis of vulva and vagina: Secondary | ICD-10-CM

## 2017-11-05 LAB — POCT GLYCOSYLATED HEMOGLOBIN (HGB A1C): HbA1c POC (<> result, manual entry): >15 % (ref 4.0–5.6)

## 2017-11-05 MED ORDER — INSULIN GLARGINE 100 UNIT/ML SOLOSTAR PEN: 17.0000 [IU] | PEN_INJECTOR | Freq: Two times a day (BID) | SUBCUTANEOUS | 99 refills | Status: AC

## 2017-11-05 MED ORDER — ATORVASTATIN CALCIUM 40 MG PO TABS: 40.0000 mg | ORAL_TABLET | Freq: Every day | ORAL | 3 refills | Status: AC

## 2017-11-05 MED ORDER — METFORMIN HCL 1000 MG PO TABS: ORAL_TABLET | ORAL | 11 refills | Status: DC

## 2017-11-05 MED ORDER — FLUOXETINE HCL 10 MG PO TABS: 10.0000 mg | ORAL_TABLET | Freq: Every day | ORAL | 3 refills | Status: AC

## 2017-11-05 MED ORDER — FLUCONAZOLE 150 MG PO TABS: 150.0000 mg | ORAL_TABLET | Freq: Once | ORAL | 0 refills | Status: AC

## 2017-11-05 NOTE — Assessment & Plan Note (Signed)
Will stop the Lexapro since patient does not feel that it is helpful and start Prozac 10 mg this medication would be beneficial for this patient since she often misses doses, and it has a long half-life.  Told patient I would like to see her back in 2 weeks to further discuss her anxiety.  Also discussed the benefits of therapy and told her that we have an integrative care department here that could provide her with some better coping mechanisms for her anxiety

## 2017-11-05 NOTE — Progress Notes (Signed)
Subjective:    [name] - 57 y.o. female MRN [id number]  Date of birth: 1961-01-21  HPI  WALLIE ROCKEY is here for diabetes follow up.  She would also like to discuss vaginal itching, cracked lips  Diabetes - BS in the 300s mostly  - diet: says that she eats too much chocolate, chips, sodas - hypoglycemic symptoms: none - current regimen: metformin 500 mg BID and Lantus 24 units, but sometimes forgets because of how hectic her life is - willing to switch to a high intensity statin  Vaginal itching - thinks it is a yeast infection, which she usually gets when her sugar is high - was given diflucan in the past, which helped - duration of a couple weeks - no change in vaginal discharge, no pain - no change in sexual partners  Substance use - anxiety makes it difficult to stop using marijuana and crack -Took Lexapro for a short while, but she says that it made her feel "like a zombie" so she no longer takes this medication.  She would like to try different medication - says that she "prays every day to quit" - her daughter recently died from a stroke, and DSS transferred her other daughter's children to her house, so she is caring for multiple small children, one of which has sickle cell disease    Health Maintenance:  - willing to get tetanus today; will get flu and pneumonia at a later date Health Maintenance Due  Topic Date Due  . PNEUMOCOCCAL POLYSACCHARIDE VACCINE AGE 38-64 HIGH RISK  04/14/1961  . PAP SMEAR  04/14/1980  . MAMMOGRAM  04/14/2009  . COLONOSCOPY  04/14/2009  . OPHTHALMOLOGY EXAM  10/19/2014  . FOOT EXAM  03/07/2015  . TETANUS/TDAP  04/02/2015  . INFLUENZA VACCINE  09/30/2017    -  reports that she has never smoked. She has never used smokeless tobacco. - Review of Systems: Per HPI. - Past Medical History: Patient Active Problem List   Diagnosis Date Noted  . Candidiasis of female genitalia 11/05/2017  . Vitamin D deficiency 08/13/2017  . Illicit  drug use 06/17/2017  . Frequent No-show for appointment 01/30/2016  . Anxiety and depression 08/26/2015  . Keloid 07/20/2013  . Osteoporosis, unspecified 01/03/2013  . Wrist pain, chronic 08/16/2012  . Deformity, wrist acquired 08/16/2012  . History of hepatitis C 10/29/2011  . Diabetes (HCC) 10/29/2011  . Hypertension 10/29/2011  . Hyperlipidemia 10/29/2011   - Medications: reviewed and updated   Objective:   Physical Exam BP (!) 148/92   Temp 98.3 F (36.8 C) (Oral)   Ht 5\' 2"  (1.575 m)   Wt 92 lb 3.2 oz (41.8 kg)   BMI 16.86 kg/m  Gen: NAD, alert, cooperative with exam, appears thin CV: RRR, good S1/S2, no murmur Resp: CTABL, no wheezes, non-labored Skin: no rashes, normal turgor  Neuro: no gross deficits.  Psych: anxious affect and mood  Diabetic Foot Exam - Simple   Simple Foot Form Diabetic Foot exam was performed with the following findings:  Yes 11/05/2017  4:01 PM  Visual Inspection No deformities, no ulcerations, no other skin breakdown bilaterally:  Yes Sensation Testing Intact to touch and monofilament testing bilaterally:  Yes Pulse Check Posterior Tibialis and Dorsalis pulse intact bilaterally:  Yes Comments          Assessment & Plan:   Diabetes (HCC) A1c today remains greater than 15.  Patient counseled extensively on the ramifications of uncontrolled diabetes, including heart attack,  stroke, renal failure, neuropathy leading to amputation.  We will increase patient's dose of Lantus to 17 units twice daily, since that she says that she has trouble injecting the whole amount of the 24 units at once.  We will also increase her metformin dose from 500 mg twice daily to 1000 mg twice daily.  Patient exhibited teach back of these changes, so I believe that she understands what her regimen will be.  We discussed the importance of adherence extensively.  We also discussed making dietary changes such as reducing the amount of sugary and high carb foods and soft  drinks.  She would benefit from meeting with Dr. Raymondo Band to further discuss diabetes management.  We will also change her statin from pravastatin to atorvastatin 40 mg given her uncontrolled diabetes and her ten-year ASCVD risk of about 25%.  Anxiety and depression Will stop the Lexapro since patient does not feel that it is helpful and start Prozac 10 mg this medication would be beneficial for this patient since she often misses doses, and it has a long half-life.  Told patient I would like to see her back in 2 weeks to further discuss her anxiety.  Also discussed the benefits of therapy and told her that we have an integrative care department here that could provide her with some better coping mechanisms for her anxiety  Illicit drug use Patient continues to use crack and marijuana.  She is aware of their detrimental effects on her health and wants to quit.  She may benefit from integrated care for this concern, but I suspect it will be very difficult or impossible for her to completely quit the substances since she has been using them for over 20 years and she finds them very helpful for managing her anxiety.  Candidiasis of female genitalia Will prescribe Diflucan today.  Told patient that if her symptoms do not resolve with 2 doses of Diflucan, she should call the clinic and be seen, since her diabetes increases the risk for more harmful vaginal infections.  Hypertension Patient is not very adherent to her lisinopril regimen, and she is hypertensive today.  Discussed the importance of hypertension control today and told the patient that I do not want to add another medication currently when I do not know what her blood pressure is while on the medication that she has.    Lezlie Octave, M.D. 11/05/2017, 4:01 PM PGY-2, [facility]

## 2017-11-05 NOTE — Patient Instructions (Addendum)
It was nice meeting you today Ms. Arlen!  For your diabetes, please take 17 units Lantus in the morning and in the evening.  Please also take metformin 1,000 mg metformin in the morning and evening.    For your cholesterol, please stop the pravastatin and start atorvastatin once daily.   For your vaginal itching, please take Diflucan once, then call us in three days if it isn't better to get a second pill.  If you still have these symptoms after the second pill, please come in to be examined.  For your anxiety, please stop Lexapro and start Prozac.  This medication will be very helpful, especially after one or two months.  For your cracked lip, please take a multivitamin and apply lip balm frequently.  Also drink plenty of water.  Please continue to take Lisinopril for your blood pressure.  I would like to see you back in one month or less for follow up on these issues.  If you have any questions or concerns, please feel free to call the clinic.   Be well,  Dr. Frances Furbish

## 2017-11-05 NOTE — Assessment & Plan Note (Signed)
Patient is not very adherent to her lisinopril regimen, and she is hypertensive today.  Discussed the importance of hypertension control today and told the patient that I do not want to add another medication currently when I do not know what her blood pressure is while on the medication that she has.

## 2017-11-05 NOTE — Assessment & Plan Note (Signed)
Patient continues to use crack and marijuana.  She is aware of their detrimental effects on her health and wants to quit.  She may benefit from integrated care for this concern, but I suspect it will be very difficult or impossible for her to completely quit the substances since she has been using them for over 20 years and she finds them very helpful for managing her anxiety.

## 2017-11-05 NOTE — Assessment & Plan Note (Signed)
Will prescribe Diflucan today.  Told patient that if her symptoms do not resolve with 2 doses of Diflucan, she should call the clinic and be seen, since her diabetes increases the risk for more harmful vaginal infections.

## 2017-11-05 NOTE — Assessment & Plan Note (Addendum)
A1c today remains greater than 15.  Patient counseled extensively on the ramifications of uncontrolled diabetes, including heart attack, stroke, renal failure, neuropathy leading to amputation.  We will increase patient's dose of Lantus to 17 units twice daily, since that she says that she has trouble injecting the whole amount of the 24 units at once.  We will also increase her metformin dose from 500 mg twice daily to 1000 mg twice daily.  Patient exhibited teach back of these changes, so I believe that she understands what her regimen will be.  We discussed the importance of adherence extensively.  We also discussed making dietary changes such as reducing the amount of sugary and high carb foods and soft drinks.  She would benefit from meeting with Dr. Raymondo Band to further discuss diabetes management.  We will also change her statin from pravastatin to atorvastatin 40 mg given her uncontrolled diabetes and her ten-year ASCVD risk of about 25%.

## 2017-11-08 ENCOUNTER — Ambulatory Visit: Payer: BLUE CROSS/BLUE SHIELD | Admitting: Pharmacist

## 2017-11-15 ENCOUNTER — Other Ambulatory Visit: Payer: Self-pay

## 2017-11-15 MED ORDER — LISINOPRIL 20 MG PO TABS: 20.0000 mg | ORAL_TABLET | Freq: Every day | ORAL | 11 refills | Status: AC

## 2017-11-23 ENCOUNTER — Other Ambulatory Visit: Payer: Self-pay | Admitting: Family Medicine

## 2017-11-26 ENCOUNTER — Ambulatory Visit: Payer: BLUE CROSS/BLUE SHIELD | Admitting: Family Medicine

## 2018-01-11 ENCOUNTER — Ambulatory Visit: Payer: BLUE CROSS/BLUE SHIELD | Admitting: Family Medicine

## 2018-02-17 ENCOUNTER — Ambulatory Visit: Payer: BLUE CROSS/BLUE SHIELD | Admitting: Family Medicine

## 2018-05-23 ENCOUNTER — Other Ambulatory Visit: Payer: Self-pay | Admitting: Family Medicine

## 2018-06-16 ENCOUNTER — Other Ambulatory Visit: Payer: Self-pay | Admitting: Family Medicine

## 2018-07-21 ENCOUNTER — Other Ambulatory Visit: Payer: Self-pay | Admitting: Family Medicine

## 2019-01-16 ENCOUNTER — Other Ambulatory Visit: Payer: Self-pay | Admitting: *Deleted

## 2019-01-17 MED ORDER — CONTOUR NEXT TEST VI STRP: ORAL_STRIP | 12 refills | Status: AC

## 2019-06-10 ENCOUNTER — Ambulatory Visit: Payer: BLUE CROSS/BLUE SHIELD | Attending: Internal Medicine

## 2019-11-24 ENCOUNTER — Other Ambulatory Visit: Payer: BLUE CROSS/BLUE SHIELD

## 2019-11-28 ENCOUNTER — Other Ambulatory Visit: Payer: Self-pay

## 2019-11-28 ENCOUNTER — Other Ambulatory Visit: Payer: BLUE CROSS/BLUE SHIELD

## 2019-11-28 DIAGNOSIS — Z20822 Contact with and (suspected) exposure to covid-19: Secondary | ICD-10-CM

## 2019-11-29 LAB — SARS-COV-2, NAA 2 DAY TAT

## 2019-11-29 LAB — NOVEL CORONAVIRUS, NAA: SARS-CoV-2, NAA: NOT DETECTED

## 2020-07-08 ENCOUNTER — Telehealth: Payer: Self-pay | Admitting: *Deleted

## 2020-07-08 NOTE — Telephone Encounter (Signed)
Received refill request from [facility] for contour next test strips and we have not seen pt since 2019 and she said that she does have another doctor now so I removed Dr. Allena Katz as PCP and informed her that she needed to contact her PCP and ask them to refill these. [name], CMA

## 2021-08-01 ENCOUNTER — Other Ambulatory Visit: Payer: Self-pay | Admitting: Nurse Practitioner

## 2021-08-01 ENCOUNTER — Ambulatory Visit: Payer: Self-pay

## 2021-08-01 DIAGNOSIS — M542 Cervicalgia: Secondary | ICD-10-CM

## 2021-08-01 DIAGNOSIS — M545 Low back pain, unspecified: Secondary | ICD-10-CM

## 2022-06-30 IMAGING — DX DG CERVICAL SPINE COMPLETE 4+V
5 series · 5 of 5 positions shown · non-contrast
Comparison: None Available.

CLINICAL DATA: Trauma, neck pain

EXAM:
CERVICAL SPINE - COMPLETE 4+ VIEW

[c-spine lat]
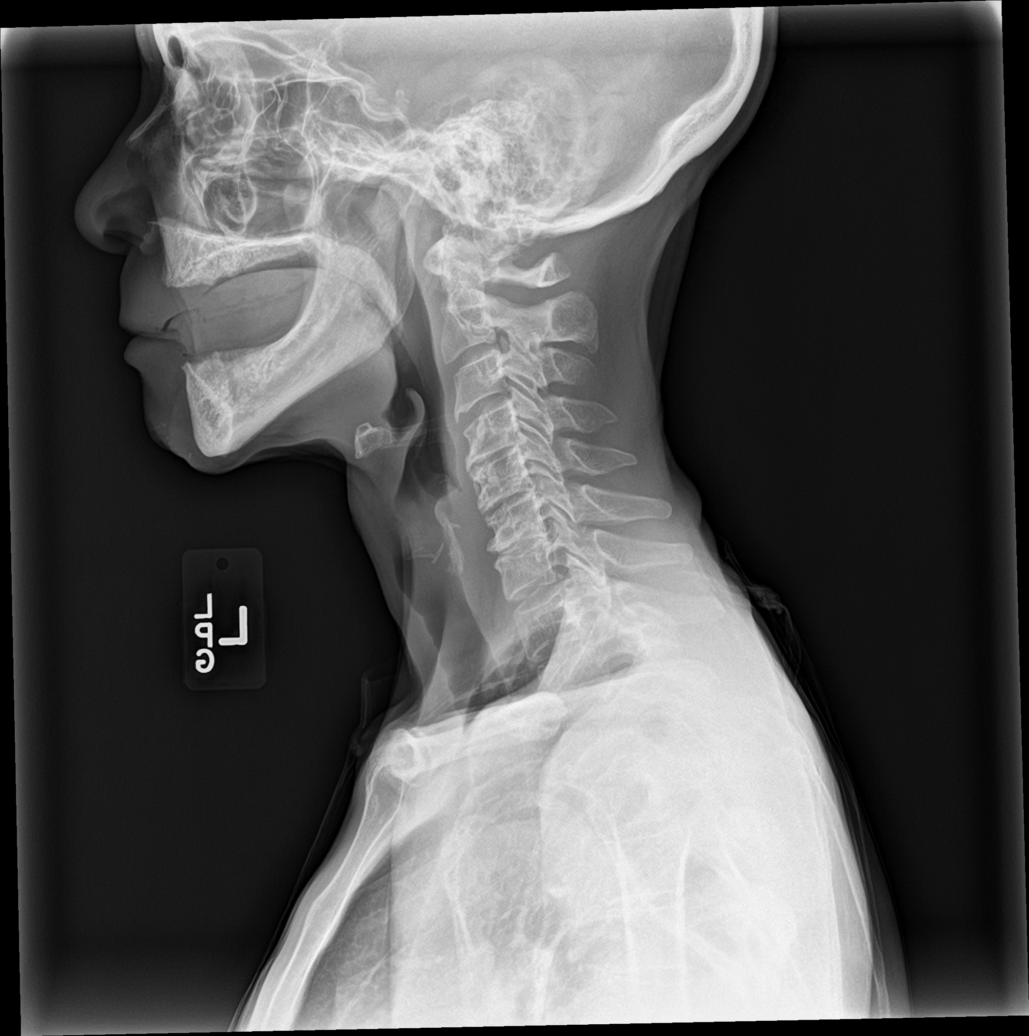

[c-spine obl (1 of 2)]
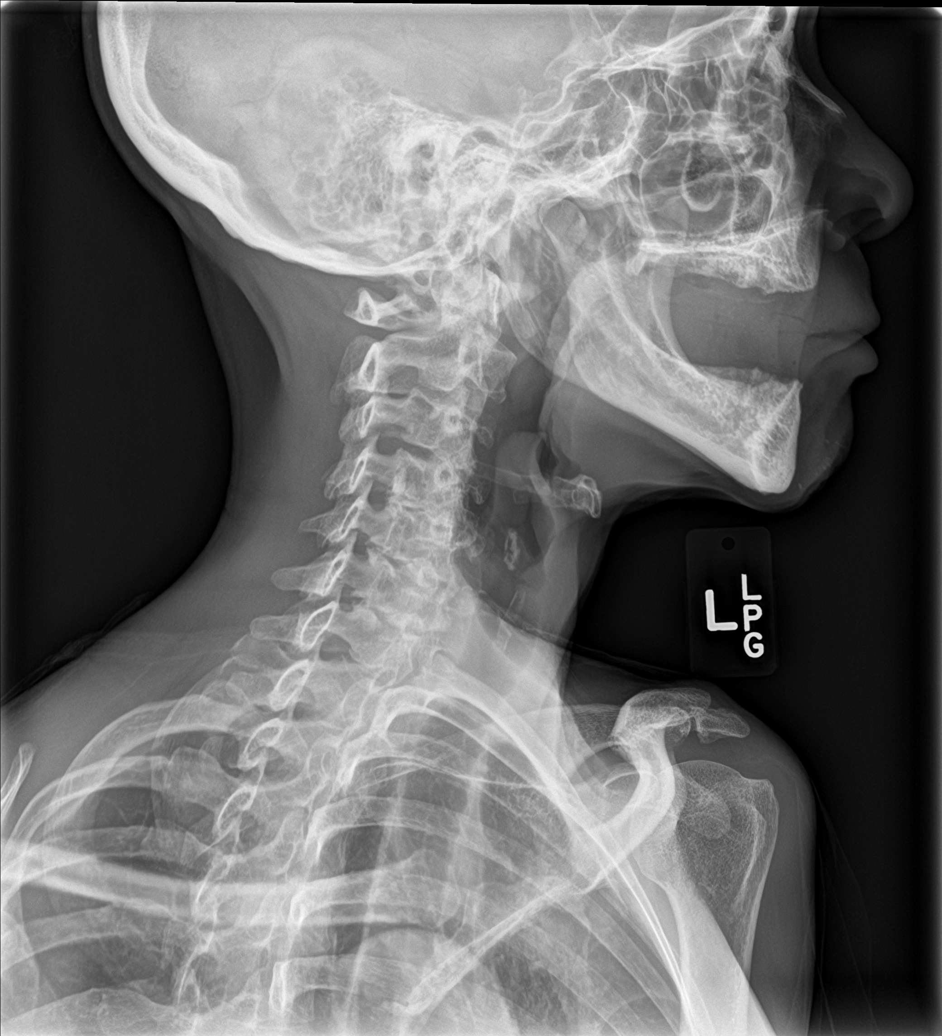

[c-spine obl (2 of 2)]
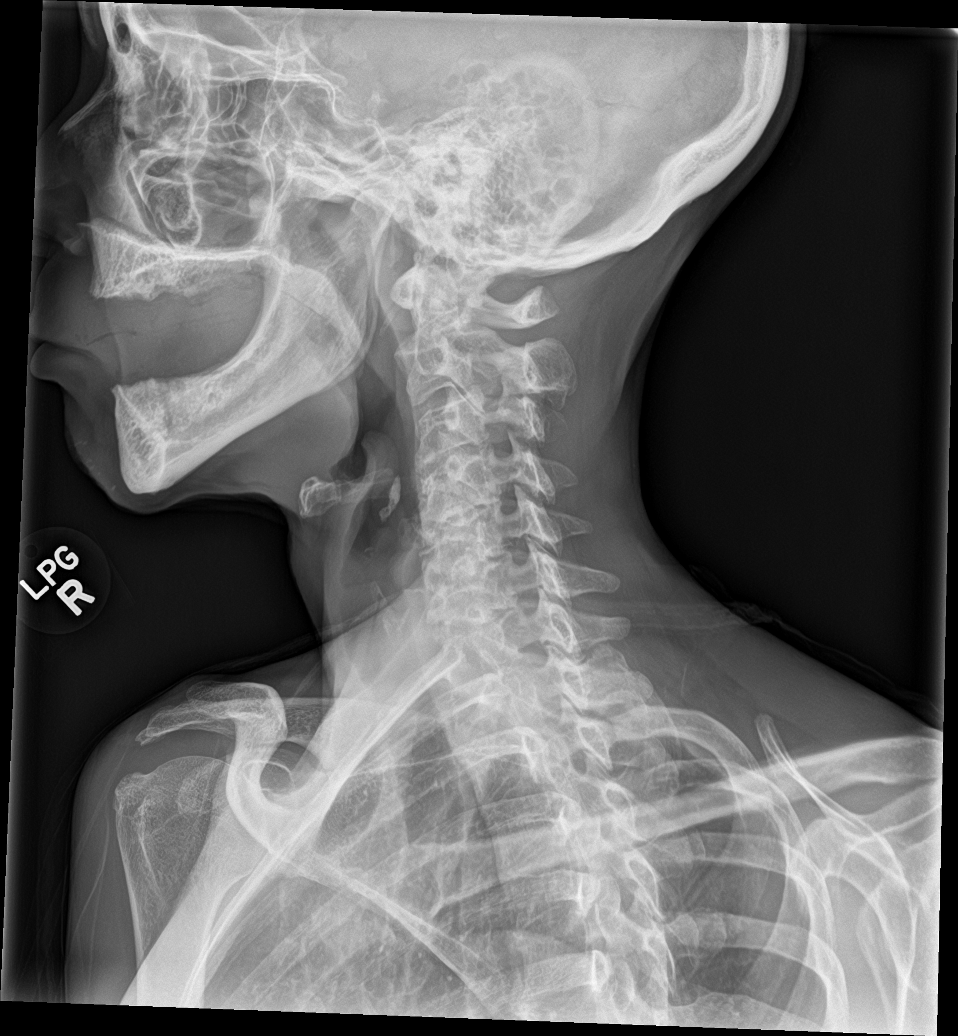

[c-spine ap]
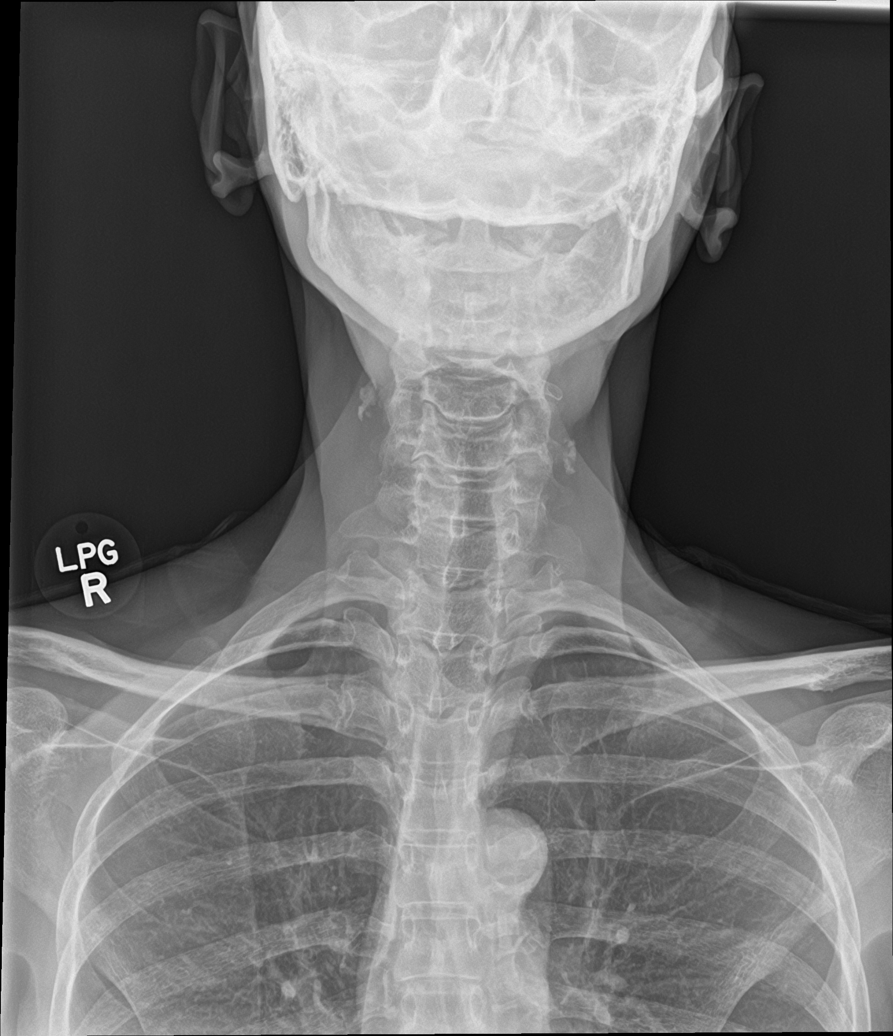

[c-spine open mouth]
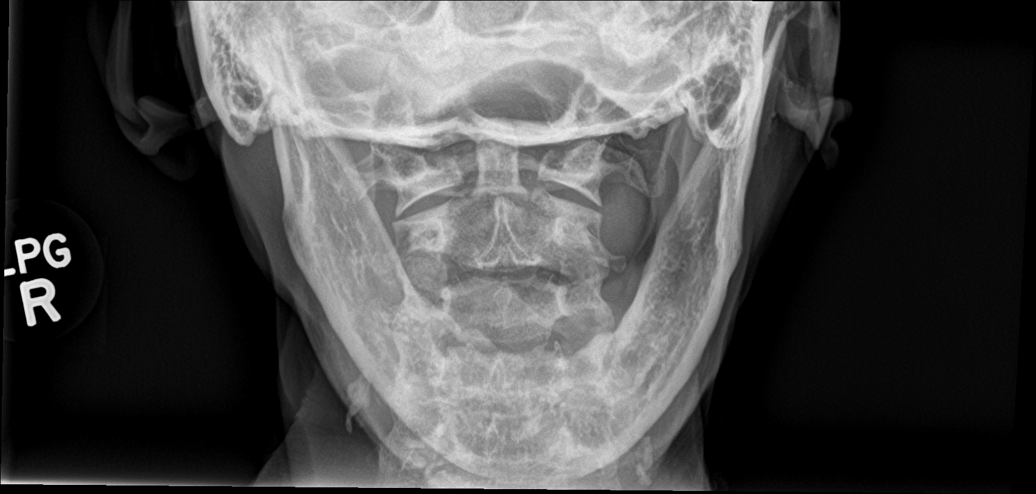

[5 of 5 positions shown; findings below may reference images not displayed]

FINDINGS: No recent fracture is seen. Degenerative changes are noted with bony
spurs from C3-C7 levels. Disc space narrowing is noted from C4-C7
levels. There is encroachment of neural foramina from C4-C7 levels.
There is possible minimal encroachment of left neural foramen at
C3-C4 level. Prevertebral soft tissues are unremarkable.
IMPRESSION: No recent fracture is seen in the cervical spine. Cervical
spondylosis with encroachment of neural foramina from C3-C7 levels.

## 2022-06-30 IMAGING — DX DG LUMBAR SPINE COMPLETE 4+V
5 series · 5 of 5 positions shown · non-contrast
Comparison: None Available.

CLINICAL DATA: Back pain, trauma

EXAM:
LUMBAR SPINE - COMPLETE 4+ VIEW

[l-spine ap]
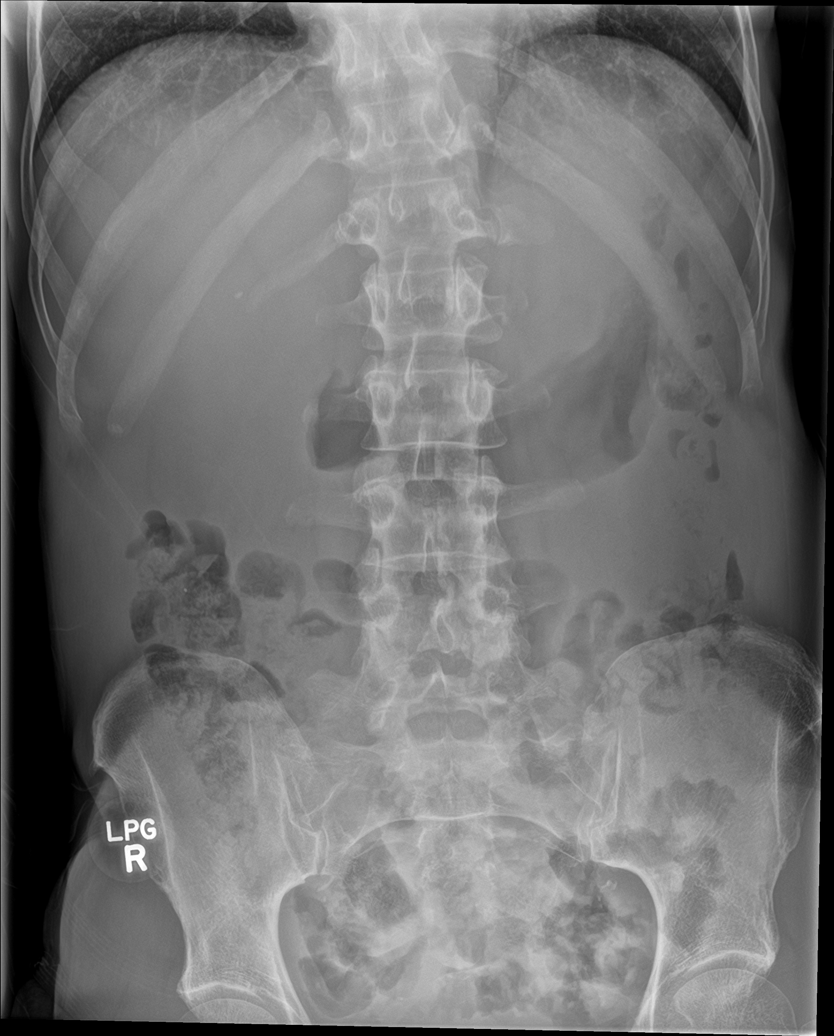

[l-spine obl (1 of 2)]
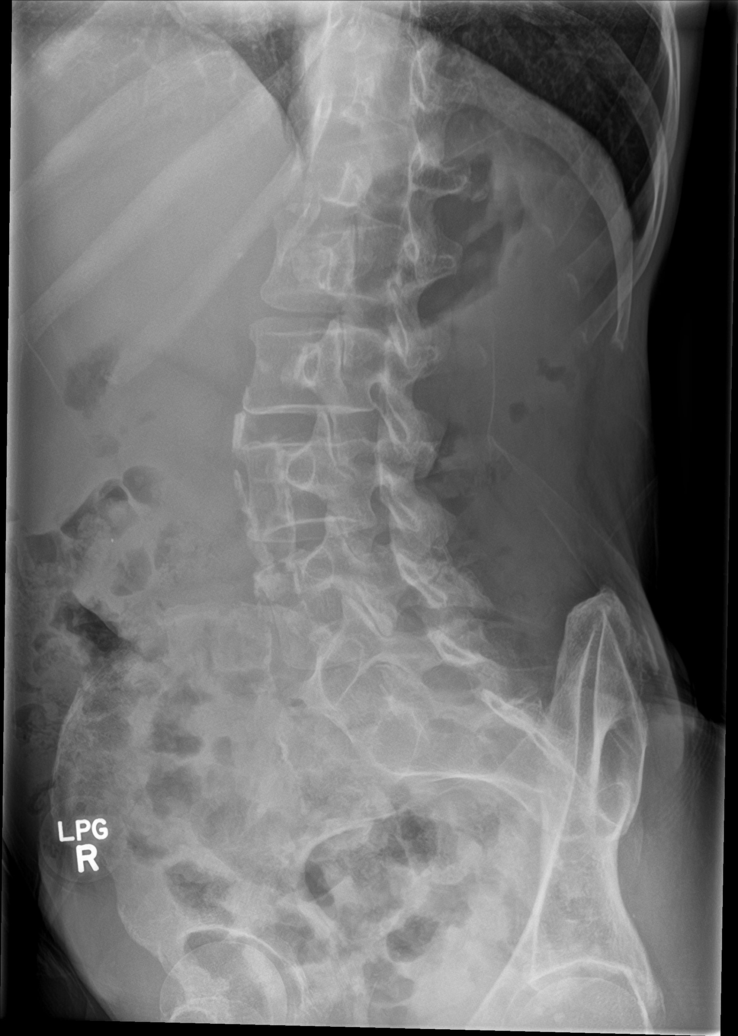

[l-spine obl (2 of 2)]
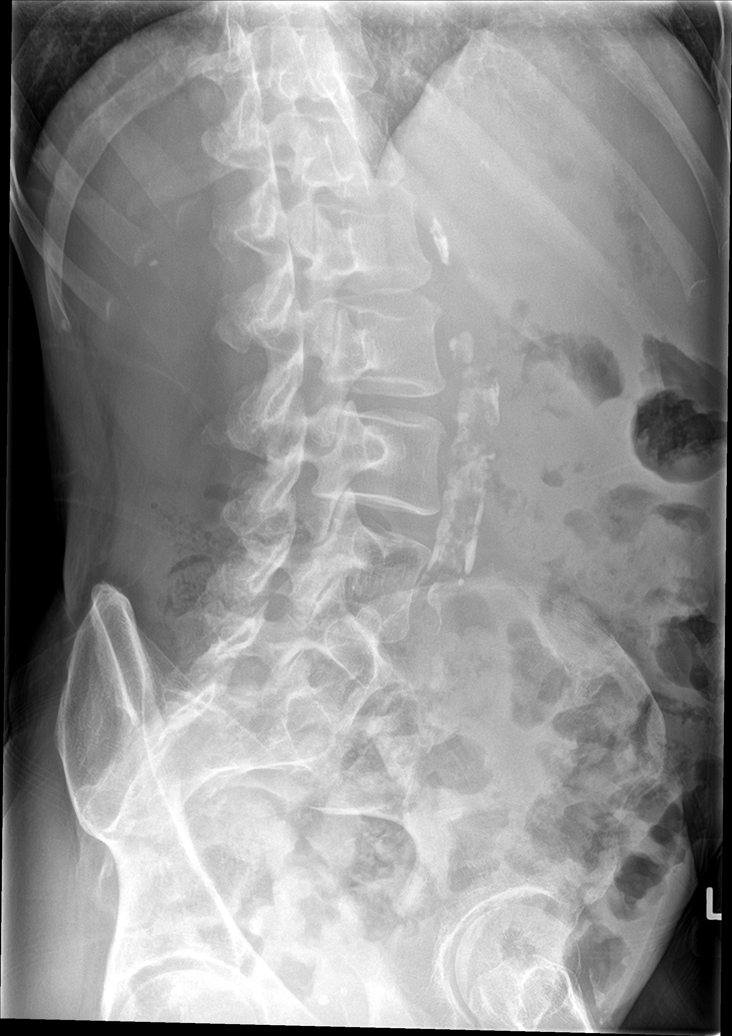

[l-spine lateral]
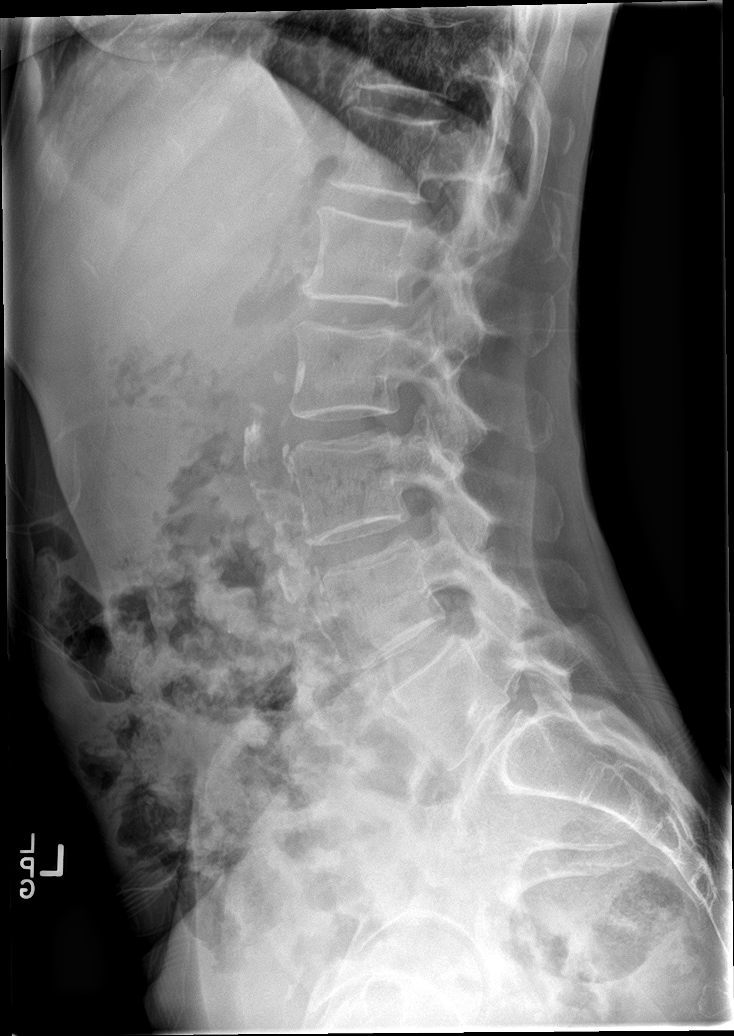

[l-spine spot]
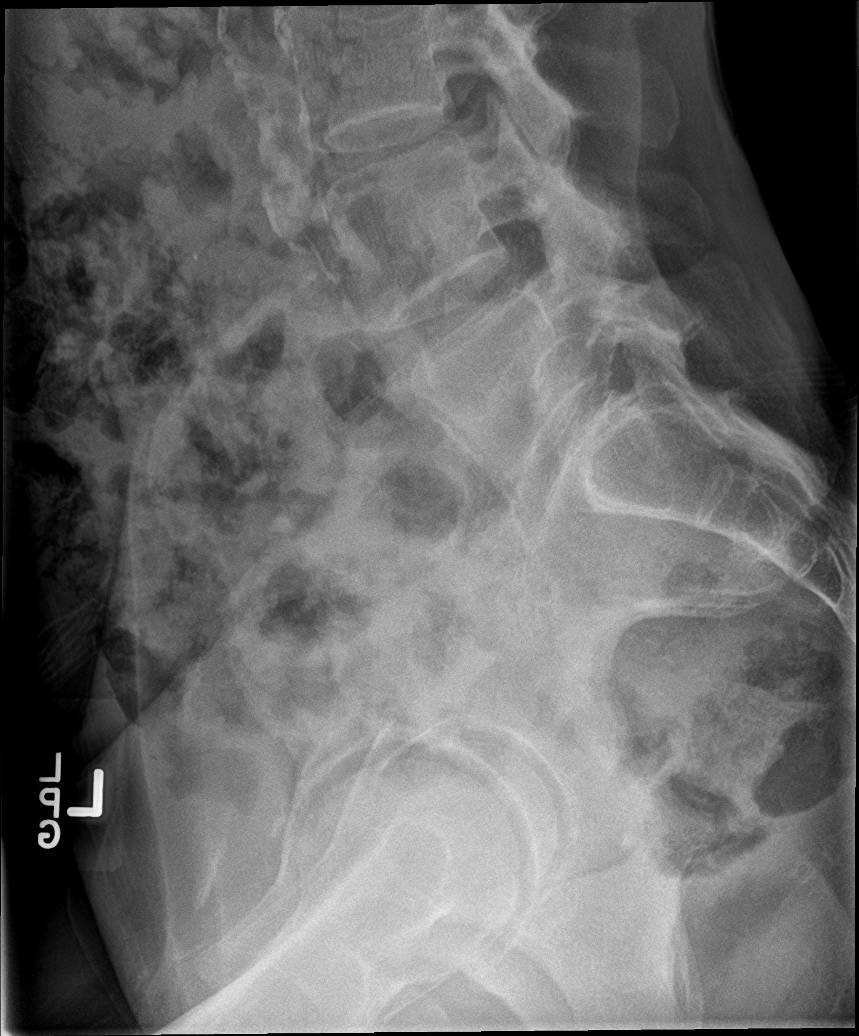

[5 of 5 positions shown; findings below may reference images not displayed]

FINDINGS: No recent fracture is seen. Last lumbar vertebra is transitional.
Narrowing of disc space at L5-S1 level may be due to transitional
nature of L5 vertebra. Alignment of posterior margins of vertebral
bodies is unremarkable. Degenerative changes are noted in facet
joints, more so in the lower lumbar region. Extensive calcifications
are seen in the abdominal aorta. There is small smooth marginated
calcification in the right upper quadrant of abdomen.
IMPRESSION: No recent fracture is seen in the lumbar spine. Degenerative changes
are noted in facet joints in the lower lumbar spine. Narrowing of
disc space at L5-S1 level may be due to transitional nature of L5
vertebra.

There is small 3 mm calcific density in the right upper quadrant of
abdomen which may suggest renal stone or vascular calcification.

## 2022-11-03 ENCOUNTER — Encounter (HOSPITAL_COMMUNITY): Payer: Self-pay | Admitting: *Deleted

## 2022-11-03 ENCOUNTER — Emergency Department (HOSPITAL_COMMUNITY): Payer: BLUE CROSS/BLUE SHIELD

## 2022-11-03 ENCOUNTER — Other Ambulatory Visit: Payer: Self-pay

## 2022-11-03 ENCOUNTER — Emergency Department (HOSPITAL_COMMUNITY)
Admission: EM | Admit: 2022-11-03 | Discharge: 2022-11-03 | Disposition: A | Discharge status: Home / Self Care | Payer: BLUE CROSS/BLUE SHIELD | Attending: Emergency Medicine | Admitting: Emergency Medicine

## 2022-11-03 DIAGNOSIS — D72829 Elevated white blood cell count, unspecified: Secondary | ICD-10-CM | POA: Insufficient documentation

## 2022-11-03 DIAGNOSIS — Z794 Long term (current) use of insulin: Secondary | ICD-10-CM | POA: Insufficient documentation

## 2022-11-03 DIAGNOSIS — N3001 Acute cystitis with hematuria: Secondary | ICD-10-CM | POA: Diagnosis not present

## 2022-11-03 DIAGNOSIS — R300 Dysuria: Secondary | ICD-10-CM | POA: Diagnosis present

## 2022-11-03 LAB — URINALYSIS, ROUTINE W REFLEX MICROSCOPIC
Bilirubin Urine: NEGATIVE
Glucose, UA: 50 mg/dL — AB
Ketones, ur: NEGATIVE mg/dL
Nitrite: NEGATIVE
Protein, ur: 100 mg/dL — AB
Specific Gravity, Urine: 1.016 (ref 1.005–1.030)
WBC, UA: >50 WBC/hpf (ref 0–5)
pH: 7 (ref 5.0–8.0)

## 2022-11-03 LAB — CBC WITH DIFFERENTIAL/PLATELET
Abs Immature Granulocytes: 0.15 10*3/uL — ABNORMAL HIGH (ref 0.00–0.07)
Basophils Absolute: 0 10*3/uL (ref 0.0–0.1)
Basophils Relative: 0 %
Eosinophils Absolute: 0.1 10*3/uL (ref 0.0–0.5)
Eosinophils Relative: 0 %
HCT: 40.4 % (ref 36.0–46.0)
Hemoglobin: 13.1 g/dL (ref 12.0–15.0)
Immature Granulocytes: 1 %
Lymphocytes Relative: 11 %
Lymphs Abs: 2 10*3/uL (ref 0.7–4.0)
MCH: 29.7 pg (ref 26.0–34.0)
MCHC: 32.4 g/dL (ref 30.0–36.0)
MCV: 91.6 fL (ref 80.0–100.0)
Monocytes Absolute: 1.8 10*3/uL — ABNORMAL HIGH (ref 0.1–1.0)
Monocytes Relative: 10 %
Neutro Abs: 14.6 10*3/uL — ABNORMAL HIGH (ref 1.7–7.7)
Neutrophils Relative %: 78 %
Platelets: 247 10*3/uL (ref 150–400)
RBC: 4.41 MIL/uL (ref 3.87–5.11)
RDW: 14.6 % (ref 11.5–15.5)
WBC: 18.6 10*3/uL — ABNORMAL HIGH (ref 4.0–10.5)
nRBC: 0 % (ref 0.0–0.2)

## 2022-11-03 LAB — BASIC METABOLIC PANEL
Anion gap: 12 (ref 5–15)
BUN: 9 mg/dL (ref 8–23)
CO2: 23 mmol/L (ref 22–32)
Calcium: 9.2 mg/dL (ref 8.9–10.3)
Chloride: 101 mmol/L (ref 98–111)
Creatinine, Ser: 0.73 mg/dL (ref 0.44–1.00)
GFR, Estimated: >60 mL/min (ref >60)
Glucose, Bld: 191 mg/dL — ABNORMAL HIGH (ref 70–99)
Potassium: 4.1 mmol/L (ref 3.5–5.1)
Sodium: 136 mmol/L (ref 135–145)

## 2022-11-03 MED ORDER — SODIUM CHLORIDE 0.9 % IV BOLUS
1000.0000 mL | Freq: Once | INTRAVENOUS | Status: AC
  Administered 2022-11-03: 1000 mL via INTRAVENOUS

## 2022-11-03 MED ORDER — KETOROLAC TROMETHAMINE 30 MG/ML IJ SOLN
30.0000 mg | Freq: Once | INTRAMUSCULAR | Status: AC
  Administered 2022-11-03: 30 mg via INTRAVENOUS
  Filled 2022-11-03: qty 1

## 2022-11-03 MED ORDER — SULFAMETHOXAZOLE-TRIMETHOPRIM 800-160 MG PO TABS
1.0000 | ORAL_TABLET | Freq: Once | ORAL | Status: AC
  Administered 2022-11-03: 1 via ORAL
  Filled 2022-11-03: qty 1

## 2022-11-03 MED ORDER — SULFAMETHOXAZOLE-TRIMETHOPRIM 800-160 MG PO TABS
1.0000 | ORAL_TABLET | Freq: Two times a day (BID) | ORAL | 0 refills | Status: AC
Start: 2022-11-03 — End: 2022-11-08

## 2022-11-03 MED ORDER — IBUPROFEN 800 MG PO TABS: 800.0000 mg | ORAL_TABLET | Freq: Three times a day (TID) | ORAL | 0 refills | Status: AC | PRN

## 2022-11-03 NOTE — ED Provider Notes (Signed)
[place] EMERGENCY DEPARTMENT AT [facility] Provider Note   CSN: [id number] Arrival date & time: 11/03/22  4098     History  Chief Complaint  Patient presents with   Urinary Frequency    [name] is a 62 y.o. female.  HPI   62 year old female presents emergency department with dysuria happening for the last week.  She presents today with significant fatigue.  She denies any documented fever but endorses chills.  She has had a decreased appetite but no nausea/vomiting.  No noted blood in the urine.  No history of kidney stones.  Endorses some right-sided flank pain.  Home Medications Prior to Admission medications   Medication Sig Start Date End Date Taking? Authorizing Provider  atorvastatin (LIPITOR) 40 MG tablet Take 1 tablet (40 mg total) by mouth daily. 11/05/17   Lennox Solders, MD  BAYER MICROLET LANCETS lancets Check sugars 6 times daily and per protocol for hyper and hypoglycemia 06/11/17   Beaulah Dinning, MD  Blood Glucose Monitoring Suppl (BAYER CONTOUR LINK MONITOR) w/Device KIT 1 application by Does not apply route 2 (two) times daily. 06/11/17   Beaulah Dinning, MD  cholecalciferol (VITAMIN D) 1000 units tablet TAKE 1 TABLET BY MOUTH EVERY DAY 11/23/17   Lennox Solders, MD  FLUoxetine (PROZAC) 10 MG tablet Take 1 tablet (10 mg total) by mouth daily. 11/05/17   Lennox Solders, MD  glucose blood (CONTOUR NEXT TEST) test strip Use as instructed 01/17/19   Lennox Solders, MD  Insulin Glargine (LANTUS SOLOSTAR) 100 UNIT/ML Solostar Pen Inject 17 Units into the skin 2 (two) times daily. 11/05/17   Lennox Solders, MD  lisinopril (PRINIVIL,ZESTRIL) 20 MG tablet Take 1 tablet (20 mg total) by mouth daily. 11/15/17   Lennox Solders, MD  metFORMIN (GLUCOPHAGE) 1000 MG tablet TAKE 1 TABLET BY MOUTH TWICE A DAY WITH MEALS 07/21/18   Lennox Solders, MD  pravastatin (PRAVACHOL) 20 MG tablet Take 1 tablet (20 mg total) by mouth daily. 06/24/17    Beaulah Dinning, MD  triamcinolone cream (KENALOG) 0.1 % Apply 1 application topically 2 (two) times daily. Perform for 14 days. Patient not taking: Reported on 02/17/2016 07/20/13   Briscoe Deutscher, DO      Allergies    Patient has no known allergies.    Review of Systems   Review of Systems  Constitutional:  Positive for appetite change and fatigue. Negative for fever.  Respiratory:  Negative for shortness of breath.   Cardiovascular:  Negative for chest pain.  Gastrointestinal:  Negative for abdominal pain, diarrhea and vomiting.  Genitourinary:  Positive for dysuria and flank pain. Negative for hematuria.  Skin:  Negative for rash.  Neurological:  Negative for headaches.    Physical Exam Updated Vital Signs BP 139/76   Pulse 88   Temp 99.6 F (37.6 C)   Resp 15   SpO2 100%  Physical Exam Vitals and nursing note reviewed.  Constitutional:      General: She is not in acute distress.    Appearance: Normal appearance. She is not ill-appearing.  HENT:     Head: Normocephalic.     Mouth/Throat:     Mouth: Mucous membranes are moist.  Cardiovascular:     Rate and Rhythm: Normal rate.  Pulmonary:     Effort: Pulmonary effort is normal. No respiratory distress.  Abdominal:     Palpations: Abdomen is soft.     Tenderness: There is  no abdominal tenderness.  Skin:    General: Skin is warm.  Neurological:     Mental Status: She is alert and oriented to person, place, and time. Mental status is at baseline.  Psychiatric:        Mood and Affect: Mood normal.     ED Results / Procedures / Treatments   Labs (all labs ordered are listed, but only abnormal results are displayed) Labs Reviewed  URINALYSIS, ROUTINE W REFLEX MICROSCOPIC - Abnormal; Notable for the following components:      Result Value   APPearance CLOUDY (*)    Glucose, UA 50 (*)    Hgb urine dipstick MODERATE (*)    Protein, ur 100 (*)    Leukocytes,Ua LARGE (*)    Bacteria, UA RARE (*)    Non  Squamous Epithelial 0-5 (*)    All other components within normal limits  CBC WITH DIFFERENTIAL/PLATELET - Abnormal; Notable for the following components:   WBC 18.6 (*)    Neutro Abs 14.6 (*)    Monocytes Absolute 1.8 (*)    Abs Immature Granulocytes 0.15 (*)    All other components within normal limits  BASIC METABOLIC PANEL - Abnormal; Notable for the following components:   Glucose, Bld 191 (*)    All other components within normal limits    EKG None  Radiology CT Renal Stone Study  Result Date: 11/03/2022 CLINICAL DATA:  Abdominal/flank pain EXAM: CT ABDOMEN AND PELVIS WITHOUT CONTRAST TECHNIQUE: Multidetector CT imaging of the abdomen and pelvis was performed following the standard protocol without IV contrast. RADIATION DOSE REDUCTION: This exam was performed according to the departmental dose-optimization program which includes automated exposure control, adjustment of the mA and/or kV according to patient size and/or use of iterative reconstruction technique. COMPARISON:  None Available. FINDINGS: Lower chest: Focal low attenuation material within a right lower lobe segmental bronchus. This does not appear to be occlusive. No distal postobstructive changes or atelectasis. Findings favored to reflect retained secretions. The lungs are otherwise clear. Hepatobiliary: No focal liver abnormality is seen. No gallstones, gallbladder wall thickening, or biliary dilatation. Pancreas: Unremarkable. No pancreatic ductal dilatation or surrounding inflammatory changes. Spleen: No splenic injury or perisplenic hematoma. Adrenals/Urinary Tract: Normal adrenal glands. No hydronephrosis, nephrolithiasis or focal renal contour abnormality. Dystrophic parenchymal calcification present in the upper pole of the right kidney measures 2 mm. This does not appear to be within the collecting system. Ureters and bladder are unremarkable. Stomach/Bowel: No evidence of obstruction. No focal bowel wall thickening.  Vascular/Lymphatic: Limited evaluation in the absence of intravenous contrast. Extensive atherosclerotic vascular calcifications. No aneurysm. Reproductive: Uterus and bilateral adnexa are unremarkable. Other: No abdominal wall hernia or abnormality. No abdominopelvic ascites. Musculoskeletal: No acute fracture or aggressive appearing lytic or blastic osseous lesion. IMPRESSION: 1. No evidence of nephrolithiasis, or ureterolithiasis. 2. There is a focal 2 mm parenchymal calcification in the upper pole of the right kidney but this appears outside the collecting system. 3. Extensive atherosclerotic vascular calcifications. Aortic Atherosclerosis (ICD10-I70.0). Electronically Signed   By: Malachy Moan M.D.   On: 11/03/2022 09:42    Procedures Procedures    Medications Ordered in ED Medications  sodium chloride 0.9 % bolus 1,000 mL (1,000 mLs Intravenous New Bag/Given 11/03/22 0824)  ketorolac (TORADOL) 30 MG/ML injection 30 mg (30 mg Intravenous Given 11/03/22 1610)    ED Course/ Medical Decision Making/ A&P  Medical Decision Making Amount and/or Complexity of Data Reviewed Labs: ordered. Radiology: ordered.  Risk Prescription drug management.   62 year old female presents emergency department with dysuria and right-sided flank pain, ongoing for the past week.  Subjective chills but she is afebrile here.  Was tachycardic in triage resolved on my initial evaluation.  Received IV fluids.  Patient has a benign abdominal exam.  Blood work shows a mild leukocytosis, normal kidney function.  There is a large amount of blood in the urinalysis which shows a UTI.  No history of kidney stones but given the flank pain we will pursue.  No signs of sepsis.  CT renal study shows no kidney stone or other acute abnormality.  Does mention some incidental findings.  Patient feels improved.  Will otherwise treat for urinary tract infection with antibiotic and plan for  outpatient follow-up.  Patient at this time appears safe and stable for discharge and close outpatient follow up. Discharge plan and strict return to ED precautions discussed, patient verbalizes understanding and agreement.        Final Clinical Impression(s) / ED Diagnoses Final diagnoses:  None    Rx / DC Orders ED Discharge Orders     None         Rozelle Logan, DO 11/03/22 1109

## 2022-11-03 NOTE — ED Triage Notes (Signed)
Pt states she thinks she may have a UTI, reporting lower back pain and urinary frequency for about a week. Denies fevers

## 2022-11-03 NOTE — Discharge Instructions (Addendum)
You have been seen and discharged from the emergency department.  You have been diagnosed with a urinary tract infection.  Take antibiotic as directed.  Take anti-inflammatory pain medicine as needed.  Stay well-hydrated.  Follow-up with your primary provider for further evaluation and further care. Take home medications as prescribed. If you have any worsening symptoms, high fevers, vomiting or further concerns for your health please return to an emergency department for further evaluation.

## 2023-06-23 ENCOUNTER — Encounter (HOSPITAL_COMMUNITY): Payer: Self-pay

## 2023-06-23 ENCOUNTER — Ambulatory Visit (HOSPITAL_COMMUNITY)
Admission: EM | Admit: 2023-06-23 | Discharge: 2023-06-23 | Disposition: A | Discharge status: Home / Self Care | Payer: Self-pay

## 2023-06-23 NOTE — ED Triage Notes (Signed)
 Pt states that she has a rash on her back. X1 month  Pt states that she also has the rash on both sides of her abdomen.
